# Patient Record
Sex: Female | Born: 1950 | Race: White | Hispanic: No | Marital: Married | Smoking: Never smoker
Health system: Southern US, Community
[De-identification: ages and names within clinical notes are randomized; demographics above are authoritative.]

## PROBLEM LIST (undated history)

## (undated) DIAGNOSIS — E785 Hyperlipidemia, unspecified: Secondary | ICD-10-CM

## (undated) DIAGNOSIS — M1990 Unspecified osteoarthritis, unspecified site: Secondary | ICD-10-CM

## (undated) HISTORY — DX: Hyperlipidemia, unspecified: E78.5

## (undated) HISTORY — PX: HIP SURGERY: SHX245

## (undated) HISTORY — DX: Unspecified osteoarthritis, unspecified site: M19.90

---

## 1978-12-20 HISTORY — PX: VAGINAL HYSTERECTOMY: SUR661

## 1978-12-20 HISTORY — PX: BREAST EXCISIONAL BIOPSY: SUR124

## 2007-09-14 ENCOUNTER — Ambulatory Visit: Payer: Self-pay | Admitting: Internal Medicine

## 2007-09-14 ENCOUNTER — Ambulatory Visit: Payer: Self-pay | Admitting: Unknown Physician Specialty

## 2007-10-12 ENCOUNTER — Ambulatory Visit: Payer: Self-pay | Admitting: Specialist

## 2008-07-18 ENCOUNTER — Ambulatory Visit: Payer: Self-pay | Admitting: Internal Medicine

## 2008-09-17 ENCOUNTER — Ambulatory Visit: Payer: Self-pay | Admitting: Internal Medicine

## 2009-09-19 ENCOUNTER — Ambulatory Visit: Payer: Self-pay | Admitting: Internal Medicine

## 2010-10-06 ENCOUNTER — Ambulatory Visit: Payer: Self-pay | Admitting: Internal Medicine

## 2011-10-08 ENCOUNTER — Ambulatory Visit: Payer: Self-pay | Admitting: Internal Medicine

## 2012-03-16 ENCOUNTER — Ambulatory Visit: Payer: Self-pay | Admitting: Orthopedic Surgery

## 2012-10-24 ENCOUNTER — Ambulatory Visit: Payer: Self-pay | Admitting: Internal Medicine

## 2013-09-08 ENCOUNTER — Other Ambulatory Visit: Payer: Self-pay | Admitting: Physician Assistant

## 2013-09-08 LAB — CLOSTRIDIUM DIFFICILE BY PCR

## 2013-09-09 ENCOUNTER — Emergency Department: Payer: Self-pay | Admitting: Emergency Medicine

## 2013-09-09 LAB — COMPREHENSIVE METABOLIC PANEL
Albumin: 3.6 g/dL (ref 3.4–5.0)
Anion Gap: 6 — ABNORMAL LOW (ref 7–16)
BUN: 10 mg/dL (ref 7–18)
Bilirubin,Total: 0.2 mg/dL (ref 0.2–1.0)
Calcium, Total: 9.3 mg/dL (ref 8.5–10.1)
Chloride: 105 mmol/L (ref 98–107)
Co2: 27 mmol/L (ref 21–32)
EGFR (African American): >60
EGFR (Non-African Amer.): 59 — ABNORMAL LOW
Osmolality: 276 (ref 275–301)
Potassium: 3.5 mmol/L (ref 3.5–5.1)
SGPT (ALT): 16 U/L (ref 12–78)
Sodium: 138 mmol/L (ref 136–145)
Total Protein: 7.3 g/dL (ref 6.4–8.2)

## 2013-09-09 LAB — URINALYSIS, COMPLETE
Bilirubin,UR: NEGATIVE
Blood: NEGATIVE
Glucose,UR: NEGATIVE mg/dL (ref 0–75)
Ketone: NEGATIVE
Nitrite: NEGATIVE
Protein: NEGATIVE
Specific Gravity: 1.005 (ref 1.003–1.030)

## 2013-09-09 LAB — CBC
HCT: 36.1 % (ref 35.0–47.0)
MCH: 29.8 pg (ref 26.0–34.0)
Platelet: 387 10*3/uL (ref 150–440)
RDW: 12.9 % (ref 11.5–14.5)
WBC: 10 10*3/uL (ref 3.6–11.0)

## 2013-09-27 ENCOUNTER — Encounter: Payer: Self-pay | Admitting: Orthopaedic Surgery

## 2013-10-20 ENCOUNTER — Encounter: Payer: Self-pay | Admitting: Orthopaedic Surgery

## 2013-10-26 ENCOUNTER — Ambulatory Visit: Payer: Self-pay | Admitting: Internal Medicine

## 2013-11-19 ENCOUNTER — Encounter: Payer: Self-pay | Admitting: Orthopaedic Surgery

## 2013-12-20 ENCOUNTER — Encounter: Payer: Self-pay | Admitting: Orthopaedic Surgery

## 2014-06-25 ENCOUNTER — Ambulatory Visit: Payer: Self-pay | Admitting: Internal Medicine

## 2014-09-27 ENCOUNTER — Ambulatory Visit: Payer: Self-pay | Admitting: Internal Medicine

## 2014-10-18 ENCOUNTER — Ambulatory Visit: Payer: Self-pay | Admitting: Internal Medicine

## 2014-10-28 ENCOUNTER — Ambulatory Visit: Payer: Self-pay | Admitting: Internal Medicine

## 2015-08-19 ENCOUNTER — Other Ambulatory Visit: Payer: Self-pay | Admitting: Internal Medicine

## 2015-08-19 DIAGNOSIS — Z1231 Encounter for screening mammogram for malignant neoplasm of breast: Secondary | ICD-10-CM

## 2015-10-31 ENCOUNTER — Ambulatory Visit: Payer: Self-pay

## 2015-11-04 ENCOUNTER — Ambulatory Visit
Admission: RE | Admit: 2015-11-04 | Discharge: 2015-11-04 | Disposition: A | Discharge status: Home / Self Care | Payer: 59 | Source: Ambulatory Visit | Attending: Internal Medicine | Admitting: Internal Medicine

## 2015-11-04 DIAGNOSIS — Z1231 Encounter for screening mammogram for malignant neoplasm of breast: Secondary | ICD-10-CM

## 2015-11-11 ENCOUNTER — Encounter: Payer: Self-pay | Admitting: Physical Therapy

## 2015-11-11 ENCOUNTER — Ambulatory Visit: Payer: 59 | Attending: Physician Assistant | Admitting: Physical Therapy

## 2015-11-11 DIAGNOSIS — M25552 Pain in left hip: Secondary | ICD-10-CM

## 2015-11-11 DIAGNOSIS — M6281 Muscle weakness (generalized): Secondary | ICD-10-CM | POA: Diagnosis present

## 2015-11-11 NOTE — Therapy (Signed)
[facility] AND [facility] MEDICINE [address] [place], [place], [address] Phone: [phone]   Fax:  [phone]  Physical Therapy Evaluation  Patient Details  Name: Melissa DakinCaroline A Digiulio MRN: [id number] Date of Birth: 07/12/1951 Referring Provider: Jannette Spannerchabon  Encounter Date: 11/11/2015      PT End of Session - 11/11/15 1253    Visit Number 1   Number of Visits 9   Date for PT Re-Evaluation 12/19/15   PT Start Time 1130   PT Stop Time 1230   PT Time Calculation (min) 60 min   Activity Tolerance Patient tolerated treatment well   Behavior During Therapy [facility] for tasks assessed/performed      History reviewed. No pertinent past medical history.  Past Surgical History  Procedure Laterality Date  . Breast biopsy Left 1980    excisional  . Vaginal hysterectomy  1980    partial    There were no vitals filed for this visit.  Visit Diagnosis:  Left hip pain - Plan: PT plan of care cert/re-cert  Muscle weakness - Plan: PT plan of care cert/re-cert      Subjective Assessment - 11/11/15 1135    Subjective Pt reports L hip pain approximately 2 months   Pertinent History 2 months of hip pain which began when pt began doing silver sneakers program. She has not stopped and pain has worsened. Pt had an injection 10/31/15 with no notable change.   Limitations Walking;House hold activities   How long can you walk comfortably? no pain with walking for short periods, with longer periods pt has pain after gait.   Diagnostic tests negative xray   Patient Stated Goals Return to exercise class, be able to walk without pain   Currently in Pain? No/denies            [facility] - 11/11/15 0001    Assessment   Medical Diagnosis left greater trochanteric bursitis   Referring Provider chabon   Prior Therapy none    Precautions   Precautions None   Restrictions   Weight Bearing Restrictions No   Balance Screen   Has the patient  fallen in the past 6 months No   Has the patient had a decrease in activity level because of a fear of falling?  No   Is the patient reluctant to leave their home because of a fear of falling?  No   Prior Function   Level of Independence Independent   Vocation Part time employment   Vocation Requirements sitting at computer for extended periods fo time   Leisure playing with granddaughter, walking, exercise class   Posture/Postural Control   Posture Comments Pt has LL discrepancy per radiography, L LE is longer than R with shoes which are blocked to support this.   ROM / Strength   AROM / PROM / Strength AROM;Strength   AROM   Overall AROM Comments hip AROM is WNL however at end range and with OP on L for IR and hip flex, produced pain in greater trochanter region. all others WNL   Strength   Overall Strength Comments decr. str. with IR. pain with hip abduction (mild)   Palpation   Palpation comment reproduction of pain with palpation of posterior facet, IT band, piriformis.       Objective: Sustained bridge hold to activate glutes in a deactivated TFL position - unable to feel this, modified but still unable to do so.  Prone hip extension - again pt  had difficulty feeling glute activation and demonstrated significant hip rotation substitution.  Seated glute squeeze with 5 sec. Holds - pt able to achieve this so issued this as her HEP. Noted definite soreness in region of her pain following this. "A good soreness".  Extensive STM performed proximal to greater trochanter. No change in tone even with more aggressive tx. And pt continued to have pain with IR following STM.  Educated pt on avoiding getting up on the wrong side when standing over the weekend.                    PT Education - 11/11/15 1251    Education provided Yes   Education Details Educated on HEP.   Person(s) Educated Patient   Methods Explanation   Comprehension Verbalized understanding              PT Long Term Goals - 11/11/15 1258    PT LONG TERM GOAL #1   Title Pt will improve LEFS from 50 to greater than 65   Baseline 50   Time 6   Period Weeks   Status New   PT LONG TERM GOAL #2   Title Pt will report no pain with walking 30 min.   Baseline pt has pain walking 20 min.   Time 6   Period Weeks   Status New   PT LONG TERM GOAL #3   Title Pt will report no pain with PROM for IR at end range.   Baseline pain with end range   Time 6   Period Weeks   Status New               Plan - 11/11/15 1255    Clinical Impression Statement Pt is apleasant 64 y/o female with c/o 2 months hip pain. Pt has pain consistent with reactive gluteal tendinopathy including pain with palpation of posterior/lateral facets of greater trochanter, trendelenberg on L, hypomobility of hip joint with medial glides. Pt also c/o pain with crossing her legs, adducting hip, IR with overpressure of hip, end range hip flex. Pt would benefit from skilled PT to address these deficits and progress to walking/exercising pain free.   Pt will benefit from skilled therapeutic intervention in order to improve on the following deficits Pain;Improper body mechanics;Decreased range of motion;Decreased strength;Decreased mobility   Rehab Potential Good   Clinical Impairments Affecting Rehab Potential motivation, leg length discrepancy (per radiography from previous hip surgery)   PT Frequency 2x / week   PT Duration 6 weeks   PT Treatment/Interventions Dry needling;Therapeutic exercise;Manual techniques;Functional mobility training         Problem List There are no active problems to display for this patient.   Fisher,Benjamin PT 11/11/2015, 1:01 PM  [place] [facility] PHYSICAL AND SPORTS MEDICINE [address] [place], [address] Phone: [phone]   Fax:  [phone]  Name: Melissa Wheeler MRN: [id number] Date of Birth: January 23, 1951

## 2015-11-17 ENCOUNTER — Ambulatory Visit: Payer: 59 | Admitting: Physical Therapy

## 2015-11-17 DIAGNOSIS — M25552 Pain in left hip: Secondary | ICD-10-CM | POA: Diagnosis not present

## 2015-11-17 NOTE — Therapy (Signed)
[place] [facility] [address] [place], [place], [address] Phone: [phone]   Fax:  [phone]  Physical Therapy Treatment  Patient Details  Name: Melissa DakinCaroline A Albornoz MRN: [id number] Date of Birth: [birth year] Referring Provider: Jannette Spannerchabon  Encounter Date: 11/17/2015      PT End of Session - 11/17/15 0828    Visit Number 2   Number of Visits 9   Date for PT Re-Evaluation 12/19/15   PT Start Time 0745   PT Stop Time 0828   PT Time Calculation (min) 43 min   Activity Tolerance Patient tolerated treatment well   Behavior During Therapy [facility] for [facility] assessed/performed      No past medical history on file.  Past Surgical History  Procedure Laterality Date  . Breast biopsy Left 1980    excisional  . Vaginal hysterectomy  1980    partial    There were no vitals filed for this visit.  Visit Diagnosis:  Left hip pain      Subjective Assessment - 11/17/15 0746    Subjective Pt wore high heels over the weekend and this produced significantly incr. pain.   Pertinent History 2 months of hip pain which began when pt began doing silver sneakers program. She has not stopped and pain has worsened. Pt had an injection 10/31/15 with no notable change.   Limitations Walking;House hold activities   How long can you walk comfortably? no pain with walking for short periods, with longer periods pt has pain after gait.   Diagnostic tests negative xray   Patient Stated Goals Return to exercise class, be able to walk without pain   Currently in Pain? Yes   Pain Score 3                Objective: The Stick STM performed on vastus lateralis, TFL, IT band.  STM performed on vastus lateralis, TFL.  SL medial hip glides 5x1 min.  Manual stretching for IR 3x30 oscillations with hold at end range.  Stairs on 2nd step lunge stretch 5x30 reps.  Pt reported decr. Pain following stretch and STM, continues to have moderate  level of irritation with standing and gait.                  PT Education - 11/17/15 0828    Education provided Yes   Education Details HEP, avoiding heels   Person(s) Educated Patient   Comprehension Verbalized understanding;Returned demonstration             PT Long Term Goals - 11/11/15 1258    PT LONG TERM GOAL #1   Title Pt will improve LEFS from 50 to greater than 65   Baseline 50   Time 6   Period Weeks   Status New   PT LONG TERM GOAL #2   Title Pt will report no pain with walking 30 min.   Baseline pt has pain walking 20 min.   Time 6   Period Weeks   Status New   PT LONG TERM GOAL #3   Title Pt will report no pain with PROM for IR at end range.   Baseline pain with end range   Time 6   Period Weeks   Status New               Plan - 11/17/15 0829    Clinical Impression Statement Pt demonstrates incr. antalgic gait, decr. stance time on L due to incr. pain  at rest. Overall pain improved within session primarily with femoral medial glides, as well as rolling and STM. Pt also demonstrates significant limitation in hip IR and may benefit from continued PT to address this.   Pt will benefit from skilled therapeutic intervention in order to improve on the following deficits Pain;Improper body mechanics;Decreased range of motion;Decreased strength;Decreased mobility   Rehab Potential Good   Clinical Impairments Affecting Rehab Potential motivation, leg length discrepancy (per radiography from previous hip surgery)   PT Frequency 2x / week   PT Duration 6 weeks   PT Treatment/Interventions Dry needling;Therapeutic exercise;Manual techniques;Functional mobility training        Problem List There are no active problems to display for this patient.   Elizabeth Paulsen PT 11/17/2015, 9:43 AM  [place] [facility] PHYSICAL AND SPORTS MEDICINE [address] [place], [address] Phone: [phone]   Fax:   [phone]  Name: SHAWAN TOSH MRN: [id number] Date of Birth: 04/06/1951

## 2015-11-19 ENCOUNTER — Ambulatory Visit: Payer: 59 | Admitting: Physical Therapy

## 2015-11-19 DIAGNOSIS — M6281 Muscle weakness (generalized): Secondary | ICD-10-CM

## 2015-11-19 DIAGNOSIS — M25552 Pain in left hip: Secondary | ICD-10-CM | POA: Diagnosis not present

## 2015-11-19 NOTE — Therapy (Signed)
[place] [facility] MEDIC[facility] [address] [place], [place], [address] Phone: [phone]   Fax:  [phone]  Physical Therapy Treatment  Patient Details  Name: Melissa DakinCaroline A Wheeler MRN: [id number] Date of Birth: October 07, 1951 Referring Provider: Jannette Spannerchabon  Encounter Date: 11/19/2015      PT End of Session - 11/19/15 1201    Visit Number 3   Number of Visits 9   Date for PT Re-Evaluation 12/19/15   PT Start Time 1110   PT Stop Time 1150   PT Time Calculation (min) 40 min   Activity Tolerance Patient tolerated treatment well   Behavior During Therapy [facility] for tasks assessed/performed      No past medical history on file.  Past Surgical History  Procedure Laterality Date  . Breast biopsy Left 1980    excisional  . Vaginal hysterectomy  1980    partial    There were no vitals filed for this visit.  Visit Diagnosis:  Left hip pain  Muscle weakness      Subjective Assessment - 11/19/15 1113    Subjective Pt reports she had a very good day yesterday, today she has had incr. pain with walking around. Monday after her session c/o severe pain.   Pertinent History 2 months of hip pain which began when pt began doing silver sneakers program. She has not stopped and pain has worsened. Pt had an injection 10/31/15 with no notable change.   Limitations Walking;House hold activities   How long can you walk comfortably? no pain with walking for short periods, with longer periods pt has pain after gait.   Diagnostic tests negative xray   Patient Stated Goals Return to exercise class, be able to walk without pain   Currently in Pain? Yes   Pain Score 2    Pain Location Ankle        Objective: STM performed on IT band, TFL, vastus lateralis. Following this pt had improvement in pain with SLS, no change in pain with gait.  Medial hip glides 3x30 grade IV.  IR improved from 5 to 15 degr. Following this.  IR stretch 3x30 sec.  Holds.  Supine legs apart knee flops focusing on improving hip Er/IR ROM.  Supine bridge with cuing for glute activation 3x10 with 3 sec. Holds. Pt able to feel glute activation with cuing to shorten lever arm.  Ended session with anterior hip pain but no other pain.                         PT Education - 11/19/15 1201    Education provided Yes   Education Details bridge variation   Person(s) Educated Patient   Methods Explanation   Comprehension Verbalized understanding;Returned demonstration             PT Long Term Goals - 11/11/15 1258    PT LONG TERM GOAL #1   Title Pt will improve LEFS from 50 to greater than 65   Baseline 50   Time 6   Period Weeks   Status New   PT LONG TERM GOAL #2   Title Pt will report no pain with walking 30 min.   Baseline pt has pain walking 20 min.   Time 6   Period Weeks   Status New   PT LONG TERM GOAL #3   Title Pt will report no pain with PROM for IR at end range.   Baseline pain with  end range   Time 6   Period Weeks   Status New               Plan - 11/19/15 1202    Clinical Impression Statement Pt continues to have difficulty and pain with walking - at end of session no pain with SLS. highly sensitive to STM in IT band region but pt is stlil responding well to glides. difficulty with gait, pain, and muscle weakness.   Pt will benefit from skilled therapeutic intervention in order to improve on the following deficits Pain;Improper body mechanics;Decreased range of motion;Decreased strength;Decreased mobility   Rehab Potential Good   Clinical Impairments Affecting Rehab Potential motivation, leg length discrepancy (per radiography from previous hip surgery)   PT Frequency 2x / week   PT Duration 6 weeks   PT Treatment/Interventions Dry needling;Therapeutic exercise;Manual techniques;Functional mobility training        Problem List There are no active problems to display for this  patient.   Fisher,Benjamin PT 11/19/2015, 12:06 PM  [place] [facility]TS MEDICINE [address] [place], [address] Phone: [phone]   Fax:  [phone]  Name: Melissa Wheeler MRN: [id number] Date of Birth: 03-13-1951

## 2015-11-24 ENCOUNTER — Ambulatory Visit: Payer: Medicare Other | Attending: Physician Assistant | Admitting: Physical Therapy

## 2015-11-24 DIAGNOSIS — M25552 Pain in left hip: Secondary | ICD-10-CM | POA: Insufficient documentation

## 2015-11-24 DIAGNOSIS — M6281 Muscle weakness (generalized): Secondary | ICD-10-CM | POA: Diagnosis present

## 2015-11-24 NOTE — Therapy (Signed)
[place] [facility] [address] [place], [place], [address] Phone: [phone]   Fax:  [phone]  Physical Therapy Treatment  Patient Details  Name: Melissa DakinCaroline A Claunch MRN: [id number] Date of Birth: [birth year] Referring Provider: Jannette Spannerchabon  Encounter Date: 11/24/2015      PT End of Session - 11/24/15 1556    Visit Number 4   Number of Visits 9   Date for PT Re-Evaluation 12/19/15   PT Start Time 0315   PT Stop Time 0355   PT Time Calculation (min) 40 min   Activity Tolerance Patient tolerated treatment well   Behavior During Therapy [facility] for tasks assessed/performed      No past medical history on file.  Past Surgical History  Procedure Laterality Date  . Breast biopsy Left 1980    excisional  . Vaginal hysterectomy  1980    partial    There were no vitals filed for this visit.  Visit Diagnosis:  Left hip pain  Muscle weakness      Subjective Assessment - 11/24/15 1512    Subjective Pt reports she is doing much better, no pain but a "pinching" feeling in anterior hip with walking.    Pertinent History 2 months of hip pain which began when pt began doing silver sneakers program. She has not stopped and pain has worsened. Pt had an injection 10/31/15 with no notable change.   Limitations Walking;House hold activities   How long can you walk comfortably? no pain with walking for short periods, with longer periods pt has pain after gait.   Diagnostic tests negative xray   Patient Stated Goals Return to exercise class, be able to walk without pain   Currently in Pain? Yes   Pain Score 2   only with walking        Objective: Trigger point dry needling performed on distal adductor and proximal rectus femoris with significant local twitch response. Following this noted significant decr. Pain with gait.  Belt assisted lateral hip glides 3x30 grade II. Hip IR improved from 10 to 25 degr. Following  this.  Seated stretch for IR - Pt reports she has stretch in region where she gets pain in her anterior hip with this.  Performed standing wt shift with and without leg lift to practice movement into single leg stance with appropriate muscle activation. Difficulty shifting onto L but able to perform with cuing to avoid tensing into movement.                         PT Education - 11/24/15 1554    Education provided Yes   Education Details Seated pretzel stretch   Person(s) Educated Patient   Methods Explanation   Comprehension Verbalized understanding;Returned demonstration             PT Long Term Goals - 11/11/15 1258    PT LONG TERM GOAL #1   Title Pt will improve LEFS from 50 to greater than 65   Baseline 50   Time 6   Period Weeks   Status New   PT LONG TERM GOAL #2   Title Pt will report no pain with walking 30 min.   Baseline pt has pain walking 20 min.   Time 6   Period Weeks   Status New   PT LONG TERM GOAL #3   Title Pt will report no pain with PROM for IR at end range.  Baseline pain with end range   Time 6   Period Weeks   Status New               Plan - 11/24/15 1647    Clinical Impression Statement Pt has made significant improvement overall with pain, is now primarily complaining of an anterior pinching feeling. Pt responded well to trigger point dry needling. altered gait and continued pain in L hip.   Pt will benefit from skilled therapeutic intervention in order to improve on the following deficits Pain;Improper body mechanics;Decreased range of motion;Decreased strength;Decreased mobility   Rehab Potential Good   Clinical Impairments Affecting Rehab Potential motivation, leg length discrepancy (per radiography from previous hip surgery)   PT Frequency 2x / week   PT Duration 6 weeks   PT Treatment/Interventions Dry needling;Therapeutic exercise;Manual techniques;Functional mobility training        Problem  List There are no active problems to display for this patient.   Fisher,Benjamin PT, DPT 11/24/2015, 4:51 PM  [place] [facility] PHYSICAL AND SPORTS MEDICINE [address] [place], [address] Phone: [phone]   Fax:  [phone]  Name: Melissa Wheeler MRN: [id number] Date of Birth: 16-Nov-1951

## 2015-11-27 ENCOUNTER — Ambulatory Visit: Payer: Medicare Other | Admitting: Physical Therapy

## 2015-11-27 DIAGNOSIS — M25552 Pain in left hip: Secondary | ICD-10-CM

## 2015-11-27 DIAGNOSIS — M6281 Muscle weakness (generalized): Secondary | ICD-10-CM

## 2015-11-27 NOTE — Therapy (Signed)
[place] [facility] PH[facility] SPORTS MEDICINE [address] [place], [address] Phone: [phone]   Fax:  [phone]  Physical Therapy Treatment  Patient Details  Name: Melissa Wheeler MRN: [id number] Date of Birth: 15-Nov-1951 Referring Provider: Jannette Spanner  Encounter Date: 11/27/2015      PT End of Session - 11/27/15 0843    Visit Number 5   Number of Visits 9   Date for PT Re-Evaluation 12/19/15   PT Start Time 0800   PT Stop Time 0830   PT Time Calculation (min) 30 min   Activity Tolerance Patient tolerated treatment well   Behavior During Therapy [facility] for tasks assessed/performed      No past medical history on file.  Past Surgical History  Procedure Laterality Date  . Breast biopsy Left 1980    excisional  . Vaginal hysterectomy  1980    partial    There were no vitals filed for this visit.  Visit Diagnosis:  Left hip pain  Muscle weakness      Subjective Assessment - 11/27/15 0801    Subjective Pt reports no pain, continues to have pinch. Felt significant relief for two days and then pinching feeling returned yesterday.   Pertinent History 2 months of hip pain which began when pt began doing silver sneakers program. She has not stopped and pain has worsened. Pt had an injection 10/31/15 with no notable change.   Limitations Walking;House hold activities   How long can you walk comfortably? no pain with walking for short periods, with longer periods pt has pain after gait.   Diagnostic tests negative xray   Patient Stated Goals Return to exercise class, be able to walk without pain   Currently in Pain? No/denies   Pain Score 0-No pain         Objective: Trigger point dry needling performed on L distal adductor in multiple locations with significant c/o pain, worse as needles moved proximally.  STM performed in same region which was well tolerated with goal of reducing tone inanterior musculature to reduce  pinching in inguinal region.  Discontinued chair stretch as this is not helping pt. Cued pt to avoid end range knee flop.   kegel modified with breathing to activate adductors.  Single leg standing toe taps behind back focusing on maintaining good control.  Pt had no difficulty with exercises but required cuing to perform well and to avoid compensatory hip lean.                       PT Education - 11/27/15 0803    Education provided Yes   Education Details educated pt on new HEP   Person(s) Educated Patient   Methods Explanation   Comprehension Verbalized understanding;Returned demonstration             PT Long Term Goals - 11/11/15 1258    PT LONG TERM GOAL #1   Title Pt will improve LEFS from 50 to greater than 65   Baseline 50   Time 6   Period Weeks   Status New   PT LONG TERM GOAL #2   Title Pt will report no pain with walking 30 min.   Baseline pt has pain walking 20 min.   Time 6   Period Weeks   Status New   PT LONG TERM GOAL #3   Title Pt will report no pain with PROM for IR at end range.   Baseline pain  with end range   Time 6   Period Weeks   Status New               Plan - 11/27/15 0844    Clinical Impression Statement Decr. pinching feeling overall and no pain. Pt continues to have significant pain with palpation of distal adductor and impaired gait.   Pt will benefit from skilled therapeutic intervention in order to improve on the following deficits Pain;Improper body mechanics;Decreased range of motion;Decreased strength;Decreased mobility   Rehab Potential Good   Clinical Impairments Affecting Rehab Potential motivation, leg length discrepancy (per radiography from previous hip surgery)   PT Frequency 2x / week   PT Duration 6 weeks   PT Treatment/Interventions Dry needling;Therapeutic exercise;Manual techniques;Functional mobility training        Problem List There are no active problems to display for this  patient.   Garek Schuneman PT DPT 11/27/2015, 8:49 AM  [place] [facility] [address] [place], [place], [address] Phone: [phone]   Fax:  [phone]  Name: Melissa Wheeler MRN: [id number] Date of Birth: [birth year]

## 2015-12-01 ENCOUNTER — Ambulatory Visit: Payer: Medicare Other | Admitting: Physical Therapy

## 2015-12-01 DIAGNOSIS — M25552 Pain in left hip: Secondary | ICD-10-CM | POA: Diagnosis not present

## 2015-12-01 DIAGNOSIS — M6281 Muscle weakness (generalized): Secondary | ICD-10-CM

## 2015-12-01 NOTE — Therapy (Signed)
[place] [address] [place], [address] Phone: [phone]   Fax:  [phone]  Physical Therapy Treatment  Patient Details  Name: Melissa Wheeler MRN: [id number] Date of Birth: 07-04-1951 Referring Provider: Jannette Spanner  Encounter Date: 12/01/2015      PT End of Session - 12/01/15 1516    Visit Number 6   Number of Visits 9   Date for PT Re-Evaluation 12/19/15   PT Start Time 1515   PT Stop Time 1545   PT Time Calculation (min) 30 min   Activity Tolerance Patient tolerated treatment well   Behavior During Therapy [facility] for tasks assessed/performed      No past medical history on file.  Past Surgical History  Procedure Laterality Date  . Breast biopsy Left 1980    excisional  . Vaginal hysterectomy  1980    partial    There were no vitals filed for this visit.  Visit Diagnosis:  Left hip pain  Muscle weakness      Subjective Assessment - 12/01/15 1513    Subjective "you cured me". Pt reports she is doing significantly better today.    Pertinent History 2 months of hip pain which began when pt began doing silver sneakers program. She has not stopped and pain has worsened. Pt had an injection 10/31/15 with no notable change.   Limitations Walking;House hold activities   How long can you walk comfortably? no pain with walking for short periods, with longer periods pt has pain after gait.   Diagnostic tests negative xray   Patient Stated Goals Return to exercise class, be able to walk without pain   Currently in Pain? No/denies                Objective: Supine position STM performed on distal adductor, quad, IT band region.   Trigger point dry needling performed on these same regions (vastus lateralis rather than IT band region).   Significant pain noted with dry needling. Following this pt performed knee flops, amb in clinic to facilitate maintenance of improved hip IR (IR  improved to 45 degr. From 25 degr.)  Pt encouraged to continue with HEP.                       PT Long Term Goals - 11/11/15 1258    PT LONG TERM GOAL #1   Title Pt will improve LEFS from 50 to greater than 65   Baseline 50   Time 6   Period Weeks   Status New   PT LONG TERM GOAL #2   Title Pt will report no pain with walking 30 min.   Baseline pt has pain walking 20 min.   Time 6   Period Weeks   Status New   PT LONG TERM GOAL #3   Title Pt will report no pain with PROM for IR at end range.   Baseline pain with end range   Time 6   Period Weeks   Status New               Plan - 12/01/15 1548    Clinical Impression Statement Pt is now feeling nearly 100% better. She is no longer having adductor pain. Pt primarily now has soreness rather than pain. PT encouraged pt to return to her more normal activities including walking.   Pt will benefit from skilled therapeutic intervention in order to improve on the following  deficits Pain;Improper body mechanics;Decreased range of motion;Decreased strength;Decreased mobility   Rehab Potential Good   Clinical Impairments Affecting Rehab Potential t i   PT Frequency 2x / week   PT Duration 6 weeks   PT Treatment/Interventions Dry needling;Therapeutic exercise;Manual techniques;Functional mobility training        Problem List There are no active problems to display for this patient.   Fisher,Benjamin PT DPT 12/01/2015, 3:58 PM  [facility] [address] [place], [place], [address] Phone: [phone]   Fax:  [phone]  Name: Melissa Wheeler MRN: [id number] Date of Birth: May 20, 1951

## 2015-12-03 ENCOUNTER — Ambulatory Visit: Payer: Medicare Other | Admitting: Physical Therapy

## 2015-12-08 ENCOUNTER — Ambulatory Visit: Payer: Medicare Other | Admitting: Physical Therapy

## 2015-12-08 DIAGNOSIS — M6281 Muscle weakness (generalized): Secondary | ICD-10-CM

## 2015-12-08 DIAGNOSIS — M25552 Pain in left hip: Secondary | ICD-10-CM | POA: Diagnosis not present

## 2015-12-08 NOTE — Therapy (Signed)
[place] [address], [place], [address] Phone: [phone]   Fax:  [phone]  Physical Therapy Treatment  Patient Details  Name: Melissa Wheeler MRN: [id number] Date of Birth: 05-15-1951 Referring Provider: Jannette Spanner  Encounter Date: 12/08/2015      PT End of Session - 12/08/15 0957    Visit Number 6   Number of Visits 9   Date for PT Re-Evaluation 12/19/15   PT Start Time 0945   PT Stop Time 1013   PT Time Calculation (min) 28 min   Activity Tolerance Patient tolerated treatment well   Behavior During Therapy [facility] for tasks assessed/performed      No past medical history on file.  Past Surgical History  Procedure Laterality Date  . Breast biopsy Left 1980    excisional  . Vaginal hysterectomy  1980    partial    There were no vitals filed for this visit.  Visit Diagnosis:  Muscle weakness      Subjective Assessment - 12/08/15 0945    Subjective Pt reports no symptoms since previous session. "I'm not even sure I should be here".   Pertinent History 2 months of hip pain which began when pt began doing silver sneakers program. She has not stopped and pain has worsened. Pt had an injection 10/31/15 with no notable change.   How long can you walk comfortably? no pain with walking for short periods, with longer periods pt has pain after gait.   Diagnostic tests negative xray   Patient Stated Goals Return to exercise class, be able to walk without pain            objective: Reviewed HEP, answered pt questions regarding continuing with and how to progress HEP.  Assessed gait, pt demonstrates no lateral whip/knee valgus and improved pelvic control with gait.  Educated and had pt verbalize understanding of postural modifications: specifically to avoid crossing legs, to avoid excessive elevation stepping up, femoral positioning for ADLs requiring squatting, lunging, kneeling to minimize  compression of trochanter.  Sit<>stand with no UE with cuing for avoiding knee valgus.  Pt verbalized understanding of all exercises and home management/chore modification.                       PT Education - 12/08/15 0957    Education provided Yes   Education Details modifications to exercise routine when she returns to silver sneakers program.   Person(s) Educated Patient   Methods Explanation   Comprehension Verbalized understanding             PT Long Term Goals - 12/08/15 1026    PT LONG TERM GOAL #1   Title Pt will improve LEFS from 50 to greater than 65   Time 6   Period Weeks   Status Achieved   PT LONG TERM GOAL #2   Title Pt will report no pain with walking 30 min.   Baseline pt has pain walking 20 min.   Time 6   Period Weeks   Status Achieved   PT LONG TERM GOAL #3   Title Pt will report no pain with PROM for IR at end range.   Baseline pain with end range   Time 6   Period Weeks   Status Achieved               Plan - 12/08/15 1016    Clinical Impression Statement Pt pain is  now resolved. Pt is appropriate for D/C from PT as she is pain free, able to walk and to perform all typical ADLs with no difficulty. Issued final HEP to maintain pain free motion.        Problem List There are no active problems to display for this patient.   Fisher,Benjamin PT DPT 12/08/2015, 10:29 AM  [facility] [address] [place], [place], [address] Phone: [phone]   Fax:  [phone]  Name: Melissa Wheeler MRN: [id number] Date of Birth: 1951-09-03

## 2015-12-17 ENCOUNTER — Encounter: Payer: 59 | Admitting: Physical Therapy

## 2016-08-20 ENCOUNTER — Other Ambulatory Visit: Payer: Self-pay | Admitting: Internal Medicine

## 2016-08-20 DIAGNOSIS — R0989 Other specified symptoms and signs involving the circulatory and respiratory systems: Secondary | ICD-10-CM

## 2016-08-20 DIAGNOSIS — IMO0001 Reserved for inherently not codable concepts without codable children: Secondary | ICD-10-CM

## 2016-10-13 ENCOUNTER — Other Ambulatory Visit (INDEPENDENT_AMBULATORY_CARE_PROVIDER_SITE_OTHER): Payer: Self-pay | Admitting: Internal Medicine

## 2016-10-13 DIAGNOSIS — R0989 Other specified symptoms and signs involving the circulatory and respiratory systems: Secondary | ICD-10-CM

## 2016-10-14 ENCOUNTER — Encounter (INDEPENDENT_AMBULATORY_CARE_PROVIDER_SITE_OTHER): Payer: Self-pay | Admitting: Vascular Surgery

## 2016-10-14 ENCOUNTER — Ambulatory Visit (INDEPENDENT_AMBULATORY_CARE_PROVIDER_SITE_OTHER): Payer: Medicare Other | Admitting: Vascular Surgery

## 2016-10-14 ENCOUNTER — Ambulatory Visit (INDEPENDENT_AMBULATORY_CARE_PROVIDER_SITE_OTHER): Payer: Medicare Other

## 2016-10-14 DIAGNOSIS — E7800 Pure hypercholesterolemia, unspecified: Secondary | ICD-10-CM | POA: Diagnosis not present

## 2016-10-14 DIAGNOSIS — R0989 Other specified symptoms and signs involving the circulatory and respiratory systems: Secondary | ICD-10-CM

## 2016-10-14 DIAGNOSIS — J4520 Mild intermittent asthma, uncomplicated: Secondary | ICD-10-CM | POA: Diagnosis not present

## 2016-10-14 DIAGNOSIS — I77811 Abdominal aortic ectasia: Secondary | ICD-10-CM | POA: Insufficient documentation

## 2016-10-14 DIAGNOSIS — M1611 Unilateral primary osteoarthritis, right hip: Secondary | ICD-10-CM | POA: Diagnosis not present

## 2016-10-14 DIAGNOSIS — E785 Hyperlipidemia, unspecified: Secondary | ICD-10-CM | POA: Insufficient documentation

## 2016-10-14 NOTE — Progress Notes (Signed)
Melissa Wheeler  MRN : [id number]  Griffin DakinCaroline A Wheeler is a 65 y.o. (25-May-1951) female who presents with chief complaint of  Chief Complaint  Patient presents with  . New Patient (Initial Visit)  .  History of Present Illness: The patient presents to the office for evaluation of an abdominal aortic aneurysm. The patient is concerned because her mother had an AAA which was repaired at the age of 66. Patient denies abdominal pain or unusual back pain, no other abdominal complaints.  No history of an acute onset of painful blue discoloration of the toes.     Positive family history of AAA.   She is a nonsmoker.  Patient denies amaurosis fugax or TIA symptoms. There is no history of claudication or rest pain symptoms of the lower extremities.  The patient denies angina or shortness of breath.  AAA ultrasound shows an AAA/aortic ectasia that measures 2.2 cm  Current Meds  Medication Sig  . atorvastatin (LIPITOR) 20 MG tablet Take by mouth.  . Calcium Carbonate-Vit D-Min (CALCIUM 1200 PO) Take by mouth.    Past Medical History:  Diagnosis Date  . Hyperlipidemia     Past Surgical History:  Procedure Laterality Date  . BREAST BIOPSY Left 1980   excisional  . HIP SURGERY Right   . VAGINAL HYSTERECTOMY  1980   partial    Social History Social History  Substance Use Topics  . Smoking status: Never Smoker  . Smokeless tobacco: Never Used  . Alcohol use Yes    Family History Family History  Problem Relation Age of Onset  . AAA (abdominal aortic aneurysm) Mother   . Hyperlipidemia Mother   . Heart disease Mother   . Hyperlipidemia Father   . Heart disease Father   No family history of bleeding/clotting disorders, porphyria or autoimmune disease   Allergies  Allergen Reactions  . Promethazine Other (See Comments)     REVIEW OF SYSTEMS (Negative unless checked)  Constitutional: [] Weight loss  [] Fever   [] Chills Cardiac: [] Chest pain   [] Chest pressure   [] Palpitations   [] Shortness of breath when laying flat   [] Shortness of breath with exertion. Vascular:  [] Pain in legs with walking   [] Pain in legs at rest  [] History of DVT   [] Phlebitis   [] Swelling in legs   [] Varicose veins   [] Non-healing ulcers Pulmonary:   [] Uses home oxygen   [] Productive cough   [] Hemoptysis   [] Wheeze  [] COPD   [x] Asthma Neurologic:  [] Dizziness   [] Seizures   [] History of stroke   [] History of TIA  [] Aphasia   [] Vissual changes   [] Weakness or numbness in arm   [] Weakness or numbness in leg Musculoskeletal:   [] Joint swelling   [x] Joint pain   [] Low back pain Hematologic:  [] Easy bruising  [] Easy bleeding   [] Hypercoagulable state   [] Anemic Gastrointestinal:  [] Diarrhea   [] Vomiting  [x] Gastroesophageal reflux/heartburn   [] Difficulty swallowing. Genitourinary:  [] Chronic kidney disease   [] Difficult urination  [] Frequent urination   [] Blood in urine Skin:  [] Rashes   [] Ulcers  Psychological:  [] History of anxiety   []  History of major depression.  Wheeler Examination  Vitals:   10/14/16 0848  BP: 118/72  Pulse: 64  Resp: 17  Weight: 59.9 kg (132 lb)  Height: 5' 2.5" (1.588 m)   Body mass index is 23.76 kg/m. Gen: WD/WN, NAD Head: [place]/AT, No temporalis wasting.  Ear/Nose/Throat: Hearing grossly intact, nares w/o erythema or drainage, poor  dentition Eyes: PER, EOMI, sclera nonicteric.  Neck: Supple, no masses.  No bruit or JVD.  Pulmonary:  Good air movement, clear to auscultation bilaterally, no use of accessory muscles.  Cardiac: RRR, normal S1, S2, no Murmurs. Vascular:   Popliteal pulses normal no enlargement Vessel Right Left  Radial Palpable Palpable  Ulnar Palpable Palpable  Brachial Palpable Palpable  Carotid Palpable Palpable  Femoral Palpable Palpable  Popliteal Palpable Palpable  PT Palpable Palpable  DP Palpable Palpable   Gastrointestinal: soft, non-distended. No guarding/no  peritoneal signs.  Musculoskeletal: M/S 5/5 throughout.  Right leg longer than the left (iatrogenic s/p hip replacement)No atrophy.  Neurologic: CN 2-12 intact. Pain and light touch intact in extremities.  Right leg longer than the left (iatrogenic s/p hip replacement).  Speech is fluent. Motor exam as listed above. Psychiatric: Judgment intact, Mood & affect appropriate for pt's clinical situation. Dermatologic: No rashes or ulcers noted.  No changes consistent with cellulitis. Lymph : No Cervical lymphadenopathy, no lichenification or skin changes of chronic lymphedema.  CBC Lab Results  Component Value Date   WBC 10.0 09/09/2013   HGB 12.1 09/09/2013   HCT 36.1 09/09/2013   MCV 89 09/09/2013   PLT 387 09/09/2013    BMET    Component Value Date/Time   NA 138 09/09/2013 1154   K 3.5 09/09/2013 1154   CL 105 09/09/2013 1154   CO2 27 09/09/2013 1154   GLUCOSE 121 (H) 09/09/2013 1154   BUN 10 09/09/2013 1154   CREATININE 1.02 09/09/2013 1154   CALCIUM 9.3 09/09/2013 1154   GFRNONAA 59 (L) 09/09/2013 1154   GFRAA >60 09/09/2013 1154   CrCl cannot be calculated (Patient's most recent lab result is older than the maximum 21 days allowed.).  COAG No results found for: INR, PROTIME  Radiology No results found.   Assessment/Plan 1. Abdominal aortic ectasia (HCC) No surgery or intervention at this time. The patient has an asymptomatic abdominal aortic  Ectasia that is less than 3 cm in maximal diameter.  I have discussed the natural history of abdominal aortic aneurysm and the small risk of rupture for aneurysm less than 5 cm in size.  However, as these small aneurysms tend to enlarge over time, continued surveillance with ultrasound or CT scan is mandatory.  I have also discussed optimizing medical management with hypertension and lipid control and the importance of abstinence from tobacco.  The patient is also encouraged to exercise a minimum of 30 minutes 4 times a week.   Should the patient develop new onset abdominal or back pain or signs of peripheral embolization they are instructed to seek medical attention immediately and to alert the physician providing care that they have an aneurysm.  The patient voices their understanding. The patient follow up in about 5 years with an aortic duplex.   2. Pure hypercholesterolemia Continue statin  3. Mild intermittent asthma without complication Inhalers as prescribed no changes  4. Primary osteoarthritis of right hip Nsaids and Wheeler therapy    Levora Dredge, MD  10/14/2016 10:25 AM

## 2016-10-28 ENCOUNTER — Other Ambulatory Visit: Payer: Self-pay | Admitting: Internal Medicine

## 2016-10-28 DIAGNOSIS — Z1231 Encounter for screening mammogram for malignant neoplasm of breast: Secondary | ICD-10-CM

## 2016-12-06 ENCOUNTER — Ambulatory Visit
Admission: RE | Admit: 2016-12-06 | Discharge: 2016-12-06 | Disposition: A | Discharge status: Home / Self Care | Payer: Medicare Other | Source: Ambulatory Visit | Attending: Internal Medicine | Admitting: Internal Medicine

## 2016-12-06 ENCOUNTER — Other Ambulatory Visit: Payer: Self-pay | Admitting: Internal Medicine

## 2016-12-06 DIAGNOSIS — Z1231 Encounter for screening mammogram for malignant neoplasm of breast: Secondary | ICD-10-CM | POA: Diagnosis present

## 2017-04-14 ENCOUNTER — Other Ambulatory Visit: Payer: Self-pay | Admitting: Otolaryngology

## 2017-04-14 DIAGNOSIS — H9201 Otalgia, right ear: Secondary | ICD-10-CM

## 2017-04-19 ENCOUNTER — Ambulatory Visit
Admission: RE | Admit: 2017-04-19 | Discharge: 2017-04-19 | Disposition: A | Discharge status: Home / Self Care | Payer: Medicare Other | Source: Ambulatory Visit | Attending: Otolaryngology | Admitting: Otolaryngology

## 2017-04-19 DIAGNOSIS — H9201 Otalgia, right ear: Secondary | ICD-10-CM

## 2017-04-19 MED ORDER — IOPAMIDOL (ISOVUE-300) INJECTION 61%
75.0000 mL | Freq: Once | INTRAVENOUS | Status: AC | PRN
  Administered 2017-04-19: 75 mL via INTRAVENOUS

## 2018-01-04 ENCOUNTER — Other Ambulatory Visit: Payer: Self-pay | Admitting: Internal Medicine

## 2018-01-04 DIAGNOSIS — Z1231 Encounter for screening mammogram for malignant neoplasm of breast: Secondary | ICD-10-CM

## 2018-01-25 ENCOUNTER — Ambulatory Visit
Admission: RE | Admit: 2018-01-25 | Discharge: 2018-01-25 | Disposition: A | Discharge status: Home / Self Care | Payer: Medicare Other | Source: Ambulatory Visit | Attending: Internal Medicine | Admitting: Internal Medicine

## 2018-01-25 DIAGNOSIS — Z1231 Encounter for screening mammogram for malignant neoplasm of breast: Secondary | ICD-10-CM | POA: Diagnosis not present

## 2018-09-21 ENCOUNTER — Ambulatory Visit: Payer: Medicare Other | Attending: Student

## 2018-09-21 DIAGNOSIS — M6281 Muscle weakness (generalized): Secondary | ICD-10-CM | POA: Diagnosis present

## 2018-09-21 DIAGNOSIS — M25551 Pain in right hip: Secondary | ICD-10-CM | POA: Diagnosis present

## 2018-09-21 DIAGNOSIS — M62838 Other muscle spasm: Secondary | ICD-10-CM | POA: Diagnosis present

## 2018-09-21 NOTE — Therapy (Signed)
[facility] [address] [place], [address] Phone: [phone]   Fax:  [phone]  Physical Therapy Evaluation  Patient Details  Name: Melissa Wheeler MRN: [id number] Date of Birth: Aug 25, 1951 Referring Provider (PT): Aluisio    Encounter Date: 09/21/2018  PT End of Session - 09/21/18 0929    Visit Number  1    Number of Visits  8    Date for PT Re-Evaluation  11/21/18    Authorization Type  Medicare     PT Start Time  0830    PT Stop Time  0915    PT Time Calculation (min)  45 min    Activity Tolerance  Patient tolerated treatment well    Behavior During Therapy  [facility] for tasks assessed/performed       Past Medical History:  Diagnosis Date  . Hyperlipidemia     Past Surgical History:  Procedure Laterality Date  . BREAST EXCISIONAL BIOPSY Left 1980   x 2 benign  . HIP SURGERY Right   . VAGINAL HYSTERECTOMY  1980   partial    There were no vitals filed for this visit.   Subjective Assessment - 09/21/18 0832    Subjective  Pt reports insidious flare of Right hip pain in August, mostly noted with sitting and lying at night, achy in nature. Pt reports pain to be at the posterolateral gluteal area down to the mid thigh. Pt reports pain eventually resolved after taking melxicam. Pt was not aggravated by exercise. Pt reports regular exercise in a group setting 3-4x/week at [facility]. Pt reports a similar issue in 2016 and had some success working with Su Hoff. Pt reports 2014 anterior THA at [facility].      Pertinent History  Pt reports insidious flare of Right hip pain in August, mostly noted with sitting and lying at night, achy in nature. Pt reports pain to be at the posterolateral gluteal area down to the mid thigh. Pt reports pain eventually resolved after taking melxicam. Pt was not aggravated by exercise. Pt reports regular exercise in a group setting 3-4x/week at [facility]. Pt reports a similar issue in  2016 and had some success working with Su Hoff. Pt reports 2014 anterior THA at [facility].      How long can you sit comfortably?  None     How long can you stand comfortably?  None     How long can you walk comfortably?  None     Diagnostic tests  Xray in Sept 2019 c Aluisio in [place], everything intact.     Patient Stated Goals  Continue exercise class, prevent future exacerbation.     Currently in Pain?  No/denies         [facility] PT Assessment - 09/21/18 0001      Assessment   Medical Diagnosis  Right hip pain- posterolateral     Referring Provider (PT)  Aluisio     Prior Therapy  At this location 2016, similar      Precautions   Precautions  None      Restrictions   Weight Bearing Restrictions  No      Balance Screen   Has the patient fallen in the past 6 months  No    Has the patient had a decrease in activity level because of a fear of falling?   No    Is the patient reluctant to leave their home because of a fear of falling?  No      Home Environment   Additional Comments  bed/bath on second floor, older code steps (higher)       Prior Function   Level of Independence  Independent    Vocation  Retired    Engineer, technical sales, exercising, travel.       Sensation   Light Touch  Appears Intact      ROM / Strength   AROM / PROM / Strength  PROM;Strength      Strength   Strength Assessment Site  Hip;Knee    Right/Left Hip  Right;Left    Right Hip Flexion  5/5    Right Hip Extension  4-/5   4-/5 bent knee raise/straight leg raise   Right Hip External Rotation   4/5    Right Hip Internal Rotation  5/5    Right Hip ABduction  4-/5   horizontal abduction: 4+/5    Left Hip Flexion  4+/5    Left Hip Extension  4-/5   4-/5 bent knee raise/straight leg raise   Left Hip External Rotation  5/5    Left Hip Internal Rotation  5/5    Left Hip ABduction  4-/5   horizontal abduction: 4+/5    Right/Left Knee  Right;Left    Right Knee Flexion  5/5    Right Knee Extension  5/5     Left Knee Flexion  5/5    Left Knee Extension  5/5      Palpation   Palpation comment  pain at right external rotators      Special Tests   Other special tests  Ely's test: (+) Right    SLS: Left 35sec; Right >60sec      Transfers   Five time sit to stand comments   30-sec chair rise: 22x hands free                Objective measurements completed on examination: See above findings.      [facility] Adult PT Treatment/Exercise - 09/21/18 0001      Exercises   Exercises  Knee/Hip      Knee/Hip Exercises: Supine   Other Supine Knee/Hip Exercises  Glute max bridge c Band: 1x15      Manual Therapy   Manual Therapy  Myofascial release;Soft tissue mobilization    Soft tissue mobilization  Right hip external rotators, Right quads, Left Quads     Myofascial Release  Right hip external rotators, Right quads, Left Quads              PT Education - 09/21/18 0928    Education provided  Yes    Education Details  explained weakness in Rt external rotators, compensational overuse of Rt quads, and treatment plan     Person(s) Educated  Patient    Methods  Explanation;Demonstration    Comprehension  Verbalized understanding          PT Long Term Goals - 09/21/18 6962      PT LONG TERM GOAL #1   Title  Pt will demonstrat eindependence with advanced HEP for DC with knowledge of how to progress resistance as needed.     Time  8    Period  Weeks    Status  New      PT LONG TERM GOAL #2   Title  Pt will demonstrate 4/5 or greater strength in prone hip extension, and 4+/5 seated Rt hip External ROtation.     Time  8  Period  Weeks    Status  New      PT LONG TERM GOAL #3   Title  Pt will report no decreased pain in anterior thigh, <2x/week, and absence of posterolater hip pain ion Right.     Time  8    Period  Weeks    Status  New             Plan - 09/21/18 0930    Clinical Impression Statement  Pt presenting with weakness in bilat hip extension, bilat  hip abduction, weakness and pain with Right isometric hip external rotation. Deep palpation of Right external rotators notable for tenderness, pain, and taut bands of tissue parallel to the muscle fibers. Also noted positive Ely's test on right, with significant bilat quads muscle spasm, taut bands, and pain bilat. Functional testing is above average for age matched norms with SLS ><30sec bilat, and 30-sec chair rise=22x. Gait analysis should be performed next session to screen for trendelenburg and ascertain functional ramification of bilat glute med weakness. Pt has had dry neelding in past visits and reports success with this modality among others.     History and Personal Factors relevant to plan of care:  Prior similar episode, very active, has tolerated dry needling well in the past.     Clinical Presentation  Stable    Clinical Presentation due to:  Objective Tests and Measures     Clinical Decision Making  Moderate    Rehab Potential  Excellent    Clinical Impairments Affecting Rehab Potential  Unclear of any recent joint effusion that may have been facilitating neurological inhibition of the hip.     PT Frequency  1x / week    PT Duration  8 weeks    PT Treatment/Interventions  Dry needling;Therapeutic exercise;Manual techniques;Functional mobility training;Moist Heat;Electrical Stimulation;Patient/family education    PT Next Visit Plan  Gait assessment, expand HEP, review goals, trial dry needling and/or MFR and stretching to quads    PT Home Exercise Plan  Glute max bridge with band abduction 1x15 BID    Consulted and Agree with Plan of Care  Patient       Patient will benefit from skilled therapeutic intervention in order to improve the following deficits and impairments:  Pain, Decreased strength, Improper body mechanics, Decreased activity tolerance, Impaired flexibility  Visit Diagnosis: Muscle weakness (generalized) - Plan: PT plan of care cert/re-cert  Pain in right hip - Plan:  PT plan of care cert/re-cert  Other muscle spasm - Plan: PT plan of care cert/re-cert     Problem List Patient Active Problem List   Diagnosis Date Noted  . Abdominal aortic ectasia (HCC) 10/14/2016  . Hyperlipidemia 10/14/2016  . Intermittent asthma 10/14/2016  . Osteoarthritis of right hip 10/14/2016    9:44 AM, 09/21/18 Rosamaria Lints, PT, DPT Physical Therapist - [place] [phone] (Office)    Buccola,Allan C 09/21/2018, 9:44 AM  [facility]ORTS MEDIC[facility] [address] [place], [address] Phone: [phone]   Fax:  [phone]  Name: Melissa Wheeler MRN: [id number] Date of Birth: 03-07-1951

## 2018-10-09 ENCOUNTER — Ambulatory Visit: Payer: Medicare Other

## 2018-10-09 DIAGNOSIS — M6281 Muscle weakness (generalized): Secondary | ICD-10-CM

## 2018-10-09 DIAGNOSIS — M62838 Other muscle spasm: Secondary | ICD-10-CM

## 2018-10-09 DIAGNOSIS — M25551 Pain in right hip: Secondary | ICD-10-CM

## 2018-10-09 NOTE — Therapy (Signed)
[facility]ICINE [address], [place], [address] Phone: [phone]   Fax:  [phone]  Physical Therapy Treatment  Patient Details  Name: Melissa Wheeler MRN: [id number] Date of Birth: Feb 18, 1951 Referring Provider (PT): Aluisio    Encounter Date: 10/09/2018  PT End of Session - 10/09/18 0933    Visit Number  2    Number of Visits  8    Date for PT Re-Evaluation  11/21/18    Authorization Type  Medicare     PT Start Time  0902    PT Stop Time  0945    PT Time Calculation (min)  43 min    Activity Tolerance  Patient tolerated treatment well    Behavior During Therapy  [facility] for tasks assessed/performed       Past Medical History:  Diagnosis Date  . Hyperlipidemia     Past Surgical History:  Procedure Laterality Date  . BREAST EXCISIONAL BIOPSY Left 1980   x 2 benign  . HIP SURGERY Right   . VAGINAL HYSTERECTOMY  1980   partial    There were no vitals filed for this visit.  Subjective Assessment - 10/09/18 0926    Subjective  Patient reports improvement in pain and has not had pain since taking the meloxicam. Patient states she is currently going to the hospital to work out.     Pertinent History  Pt reports insidious flare of Right hip pain in August, mostly noted with sitting and lying at night, achy in nature. Pt reports pain to be at the posterolateral gluteal area down to the mid thigh. Pt reports pain eventually resolved after taking melxicam. Pt was not aggravated by exercise. Pt reports regular exercise in a group setting 3-4x/week at [facility]. Pt reports a similar issue in 2016 and had some success working with Su Hoff. Pt reports 2014 anterior THA at [facility].      How long can you sit comfortably?  None     How long can you stand comfortably?  None     How long can you walk comfortably?  None     Diagnostic tests  Xray in Sept 2019 c Aluisio in [place], everything intact.     Patient Stated Goals   Continue exercise class, prevent future exacerbation.     Currently in Pain?  No/denies       TREATMENT Therapeutic Exercise Bridges on physioball - 2 x 15 Bridges with feet on ground with RTB - x 15 Hip abduction at hip machine - 2 x 15 40# Hip Extension at hip machine - 2 x 15 85# Squats in standing - x20 with UE support; x20 with 10# weight Running man with furniture slider - 2 x 20 Single leg squat with UE support in doorframe - 2 x 18  Patient demonstrates increased fatigue at the end of the session      PT Education - 10/09/18 0931    Education provided  Yes    Education Details  form/technique with exercise     Person(s) Educated  Patient    Methods  Explanation;Demonstration    Comprehension  Verbalized understanding;Returned demonstration          PT Long Term Goals - 09/21/18 0938      PT LONG TERM GOAL #1   Title  Pt will demonstrat eindependence with advanced HEP for DC with knowledge of how to progress resistance as needed.     Time  8    Period  Weeks    Status  New      PT LONG TERM GOAL #2   Title  Pt will demonstrate 4/5 or greater strength in prone hip extension, and 4+/5 seated Rt hip External ROtation.     Time  8    Period  Weeks    Status  New      PT LONG TERM GOAL #3   Title  Pt will report no decreased pain in anterior thigh, <2x/week, and absence of posterolater hip pain ion Right.     Time  8    Period  Weeks    Status  New            Plan - 10/09/18 0933    Clinical Impression Statement  Focused on performing hip stabilization excerises and patient demonstrates increased weakness along hip musculature. Also focused on performing exercises in standing for better carryover into functional activities. Patient demonstrates no increased pain throuhgout the session. Patient will benefit from furhter skilled therapy to progress exercises to maintain decrease in pain and return to prior level of function.     Rehab Potential   Excellent    Clinical Impairments Affecting Rehab Potential  Unclear of any recent joint effusion that may have been facilitating neurological inhibition of the hip.     PT Frequency  1x / week    PT Duration  8 weeks    PT Treatment/Interventions  Dry needling;Therapeutic exercise;Manual techniques;Functional mobility training;Moist Heat;Electrical Stimulation;Patient/family education    PT Next Visit Plan  Gait assessment, expand HEP, review goals, trial dry needling and/or MFR and stretching to quads    PT Home Exercise Plan  Glute max bridge with band abduction 1x15 BID    Consulted and Agree with Plan of Care  Patient       Patient will benefit from skilled therapeutic intervention in order to improve the following deficits and impairments:  Pain, Decreased strength, Improper body mechanics, Decreased activity tolerance, Impaired flexibility  Visit Diagnosis: Muscle weakness (generalized)  Pain in right hip  Other muscle spasm     Problem List Patient Active Problem List   Diagnosis Date Noted  . Abdominal aortic ectasia (HCC) 10/14/2016  . Hyperlipidemia 10/14/2016  . Intermittent asthma 10/14/2016  . Osteoarthritis of right hip 10/14/2016    Myrene Galas, PT DPT 10/09/2018, 9:46 AM  [place] [facility] PHYSICAL AND SPORTS MEDICINE [address] [place], [address] Phone: [phone]   Fax:  [phone]  Name: Melissa Wheeler MRN: [id number] Date of Birth: [birth year]

## 2018-10-16 ENCOUNTER — Ambulatory Visit: Payer: Medicare Other

## 2018-10-16 DIAGNOSIS — M25551 Pain in right hip: Secondary | ICD-10-CM

## 2018-10-16 DIAGNOSIS — M62838 Other muscle spasm: Secondary | ICD-10-CM

## 2018-10-16 DIAGNOSIS — M6281 Muscle weakness (generalized): Secondary | ICD-10-CM | POA: Diagnosis not present

## 2018-10-16 NOTE — Therapy (Signed)
[place] PHYSICAL AND SPORTS MEDICINE [address] [place], [address] Phone: [phone]   Fax:  [phone]  Physical Therapy Treatment  Patient Details  Name: Melissa Wheeler MRN: [id number] Date of Birth: 01/09/1951 Referring Provider (PT): Aluisio    Encounter Date: 10/16/2018  PT End of Session - 10/16/18 1030    Visit Number  3    Number of Visits  8    Date for PT Re-Evaluation  11/21/18    Authorization Type  Medicare     PT Start Time  0900    PT Stop Time  0938    PT Time Calculation (min)  38 min    Activity Tolerance  Patient tolerated treatment well    Behavior During Therapy  [facility] for tasks assessed/performed       Past Medical History:  Diagnosis Date  . Hyperlipidemia     Past Surgical History:  Procedure Laterality Date  . BREAST EXCISIONAL BIOPSY Left 1980   x 2 benign  . HIP SURGERY Right   . VAGINAL HYSTERECTOMY  1980   partial    There were no vitals filed for this visit.  Subjective Assessment - 10/16/18 1028    Subjective  Patient reports she has been working out with an exercise program at the hospital and has not felt pain for the past two weeks. Patient reports she is prepared for discharge.     Pertinent History  Pt reports insidious flare of Right hip pain in August, mostly noted with sitting and lying at night, achy in nature. Pt reports pain to be at the posterolateral gluteal area down to the mid thigh. Pt reports pain eventually resolved after taking melxicam. Pt was not aggravated by exercise. Pt reports regular exercise in a group setting 3-4x/week at [facility]. Pt reports a similar issue in 2016 and had some success working with Mont Dutton. Pt reports 2014 anterior THA at [facility].      How long can you sit comfortably?  None     How long can you stand comfortably?  None     How long can you walk comfortably?  None     Diagnostic tests  Xray in Sept 2019 c Aluisio in [place], everything  intact.     Patient Stated Goals  Continue exercise class, prevent future exacerbation.     Currently in Pain?  No/denies       TREATMENT Therapeutic Exercise all exercises performed B Lateral lunge -- 2 x 15 Forward lunge -- 2 x 15 Single leg squat with UE suppport --  2 x  15 Running man in standing -- 2 x 15 with UE support Bridges on physioball -- 2 x 15  Hip abduction with GTB -- 2 x 15   Patient demonstrates increased fatigue at the end of the session    PT Education - 10/16/18 1030    Education provided  Yes    Education Details  form/technique with exercise    Person(s) Educated  Patient    Methods  Explanation;Demonstration    Comprehension  Verbalized understanding;Returned demonstration          PT Long Term Goals - 10/16/18 1230      PT LONG TERM GOAL #1   Title  Pt will demonstrat eindependence with advanced HEP for DC with knowledge of how to progress resistance as needed.     Baseline  independent with HEP    Time  8  Period  Weeks    Status  Achieved      PT LONG TERM GOAL #2   Title  Pt will demonstrate 4/5 or greater strength in prone hip extension, and 4+/5 seated Rt hip External ROtation.     Baseline  4+/5 or above for all observed Hip strengthening from exercising    Time  8    Period  Weeks    Status  Achieved      PT LONG TERM GOAL #3   Title  Pt will report no decreased pain in anterior thigh, <2x/week, and absence of posterolater hip pain ion Right.     Baseline  No increase in pain for 2 weeks    Time  8    Period  Weeks    Status  Achieved            Plan - 10/16/18 1229    Clinical Impression Statement  Patient has met or partially met all long term goals and has not experienced any increase in pain for the past 2 weeks indicating functional carryover between sessions. Patient required moderate amount of cueing to perform exercises requiring verbal cueing to correct. Patient is acheived her goals and is to be discharged from  physical therapy.     Rehab Potential  Excellent    Clinical Impairments Affecting Rehab Potential  Unclear of any recent joint effusion that may have been facilitating neurological inhibition of the hip.     PT Frequency  1x / week    PT Duration  8 weeks    PT Treatment/Interventions  Dry needling;Therapeutic exercise;Manual techniques;Functional mobility training;Moist Heat;Electrical Stimulation;Patient/family education    PT Next Visit Plan  Gait assessment, expand HEP, review goals, trial dry needling and/or MFR and stretching to quads    PT Home Exercise Plan  Glute max bridge with band abduction 1x15 BID    Consulted and Agree with Plan of Care  Patient       Patient will benefit from skilled therapeutic intervention in order to improve the following deficits and impairments:  Pain, Decreased strength, Improper body mechanics, Decreased activity tolerance, Impaired flexibility  Visit Diagnosis: Muscle weakness (generalized)  Pain in right hip  Other muscle spasm     Problem List Patient Active Problem List   Diagnosis Date Noted  . Abdominal aortic ectasia ([place]) 10/14/2016  . Hyperlipidemia 10/14/2016  . Intermittent asthma 10/14/2016  . Osteoarthritis of right hip 10/14/2016    Blythe Stanford, PT DPT 10/16/2018, 12:40 PM  [place] PHYSICAL AND SPORTS MEDICINE [address] [place], [address] Phone: [phone]   Fax:  [phone]  Name: Melissa Wheeler MRN: [id number] Date of Birth: 10-17-1951

## 2018-10-23 ENCOUNTER — Ambulatory Visit: Payer: Medicare Other

## 2018-10-30 ENCOUNTER — Ambulatory Visit: Payer: Medicare Other

## 2019-02-27 ENCOUNTER — Other Ambulatory Visit: Payer: Self-pay | Admitting: Internal Medicine

## 2019-02-27 DIAGNOSIS — Z1231 Encounter for screening mammogram for malignant neoplasm of breast: Secondary | ICD-10-CM

## 2019-03-05 ENCOUNTER — Other Ambulatory Visit: Payer: Self-pay

## 2019-03-05 ENCOUNTER — Ambulatory Visit
Admission: RE | Admit: 2019-03-05 | Discharge: 2019-03-05 | Disposition: A | Discharge status: Home / Self Care | Payer: Medicare Other | Source: Ambulatory Visit | Attending: Internal Medicine | Admitting: Internal Medicine

## 2019-03-05 DIAGNOSIS — Z1231 Encounter for screening mammogram for malignant neoplasm of breast: Secondary | ICD-10-CM | POA: Insufficient documentation

## 2019-10-27 ENCOUNTER — Other Ambulatory Visit: Payer: Self-pay | Admitting: *Deleted

## 2019-10-27 DIAGNOSIS — Z20822 Contact with and (suspected) exposure to covid-19: Secondary | ICD-10-CM

## 2019-10-29 LAB — NOVEL CORONAVIRUS, NAA: SARS-CoV-2, NAA: NOT DETECTED

## 2019-11-28 ENCOUNTER — Other Ambulatory Visit: Payer: Self-pay

## 2019-11-28 DIAGNOSIS — Z20822 Contact with and (suspected) exposure to covid-19: Secondary | ICD-10-CM

## 2019-11-29 LAB — NOVEL CORONAVIRUS, NAA: SARS-CoV-2, NAA: NOT DETECTED

## 2020-01-10 ENCOUNTER — Ambulatory Visit: Payer: Medicare Other | Attending: Internal Medicine

## 2020-01-10 DIAGNOSIS — Z23 Encounter for immunization: Secondary | ICD-10-CM

## 2020-01-10 NOTE — Progress Notes (Signed)
   Covid-19 Vaccination Clinic  Name:  Melissa Wheeler    MRN: [id number] DOB: [birth year]  01/10/2020  [name] was observed post Covid-19 immunization for 15 minutes without incidence. She was provided with Vaccine Information Sheet and instruction to access the V-Safe system.   [name] was instructed to call 911 with any severe reactions post vaccine: Marland Kitchen Difficulty breathing  . Swelling of your face and throat  . A fast heartbeat  . A bad rash all over your body  . Dizziness and weakness    Immunizations Administered    Name Date Dose VIS Date Route   Pfizer COVID-19 Vaccine 01/10/2020  2:38 PM 0.3 mL 11/30/2019 Intramuscular   Manufacturer: [employer], [employer]   Lot: [id number]   NDC: [address]-4

## 2020-01-21 ENCOUNTER — Other Ambulatory Visit: Payer: Self-pay | Admitting: Internal Medicine

## 2020-01-21 DIAGNOSIS — Z1231 Encounter for screening mammogram for malignant neoplasm of breast: Secondary | ICD-10-CM

## 2020-01-28 ENCOUNTER — Ambulatory Visit: Payer: Medicare Other | Attending: Internal Medicine

## 2020-01-28 DIAGNOSIS — Z23 Encounter for immunization: Secondary | ICD-10-CM | POA: Insufficient documentation

## 2020-01-28 NOTE — Progress Notes (Signed)
   Covid-19 Vaccination Clinic  Name:  Melissa Wheeler    MRN: [id number] DOB: 08-18-1951  01/28/2020  [name] was observed post Covid-19 immunization for 15 minutes without incidence. She was provided with Vaccine Information Sheet and instruction to access the V-Safe system.   [name] was instructed to call 911 with any severe reactions post vaccine: Marland Kitchen Difficulty breathing  . Swelling of your face and throat  . A fast heartbeat  . A bad rash all over your body  . Dizziness and weakness    Immunizations Administered    Name Date Dose VIS Date Route   Pfizer COVID-19 Vaccine 01/28/2020  1:46 PM 0.3 mL 11/30/2019 Intramuscular   Manufacturer: [employer], [employer]   Lot: [id number]   NDC: [address]-5

## 2020-02-10 ENCOUNTER — Ambulatory Visit: Payer: Medicare Other

## 2020-03-25 ENCOUNTER — Ambulatory Visit
Admission: RE | Admit: 2020-03-25 | Discharge: 2020-03-25 | Disposition: A | Discharge status: Home / Self Care | Payer: Medicare Other | Source: Ambulatory Visit | Attending: Internal Medicine | Admitting: Internal Medicine

## 2020-03-25 DIAGNOSIS — Z1231 Encounter for screening mammogram for malignant neoplasm of breast: Secondary | ICD-10-CM | POA: Insufficient documentation

## 2021-02-23 ENCOUNTER — Other Ambulatory Visit: Payer: Self-pay | Admitting: Internal Medicine

## 2021-02-23 DIAGNOSIS — Z1231 Encounter for screening mammogram for malignant neoplasm of breast: Secondary | ICD-10-CM

## 2021-03-26 ENCOUNTER — Ambulatory Visit
Admission: RE | Admit: 2021-03-26 | Discharge: 2021-03-26 | Disposition: A | Discharge status: Home / Self Care | Payer: Medicare Other | Source: Ambulatory Visit | Attending: Internal Medicine | Admitting: Internal Medicine

## 2021-03-26 ENCOUNTER — Other Ambulatory Visit: Payer: Self-pay

## 2021-03-26 DIAGNOSIS — Z1231 Encounter for screening mammogram for malignant neoplasm of breast: Secondary | ICD-10-CM | POA: Insufficient documentation

## 2021-11-16 ENCOUNTER — Other Ambulatory Visit: Payer: Self-pay | Admitting: Internal Medicine

## 2021-11-16 DIAGNOSIS — Z13820 Encounter for screening for osteoporosis: Secondary | ICD-10-CM

## 2022-01-29 ENCOUNTER — Other Ambulatory Visit: Payer: Self-pay | Admitting: Internal Medicine

## 2022-01-29 DIAGNOSIS — Z1231 Encounter for screening mammogram for malignant neoplasm of breast: Secondary | ICD-10-CM

## 2022-04-28 ENCOUNTER — Ambulatory Visit
Admission: RE | Admit: 2022-04-28 | Discharge: 2022-04-28 | Disposition: A | Discharge status: Home / Self Care | Payer: Medicare Other | Source: Ambulatory Visit | Attending: Internal Medicine | Admitting: Internal Medicine

## 2022-04-28 DIAGNOSIS — Z1231 Encounter for screening mammogram for malignant neoplasm of breast: Secondary | ICD-10-CM

## 2022-04-28 DIAGNOSIS — Z13820 Encounter for screening for osteoporosis: Secondary | ICD-10-CM

## 2022-06-09 IMAGING — MG MM DIGITAL SCREENING BILAT W/ TOMO AND CAD
8 series · 9 of 24 positions shown · non-contrast
Comparison: Previous exam(s).

CLINICAL DATA: Screening.

EXAM:
DIGITAL SCREENING BILATERAL MAMMOGRAM WITH TOMOSYNTHESIS AND CAD
TECHNIQUE: Bilateral screening digital craniocaudal and mediolateral oblique
mammograms were obtained. Bilateral screening digital breast
tomosynthesis was performed. The images were evaluated with
computer-aided detection.

[L CC synth-2D]
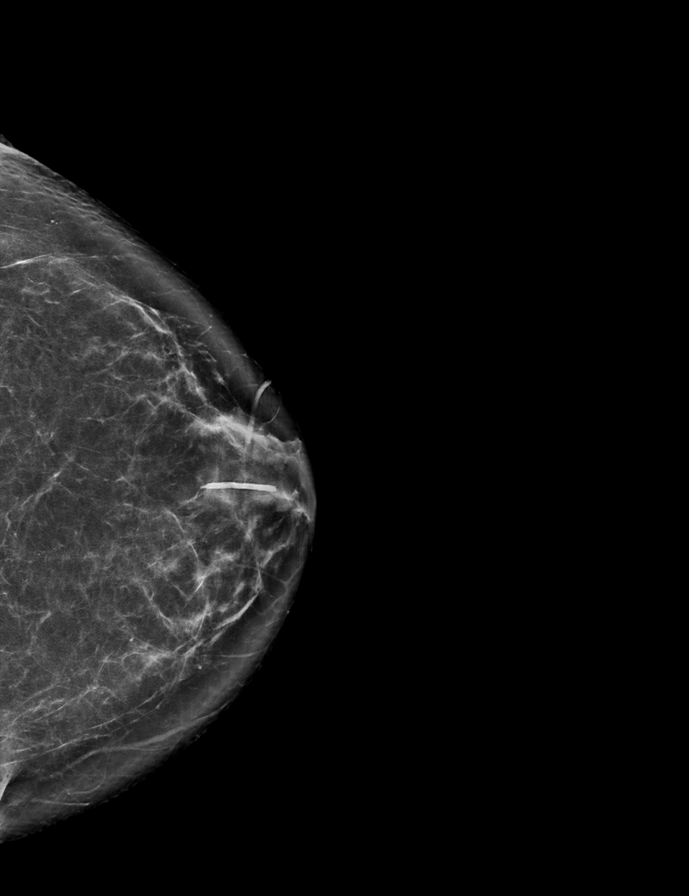

[L MLO synth-2D]
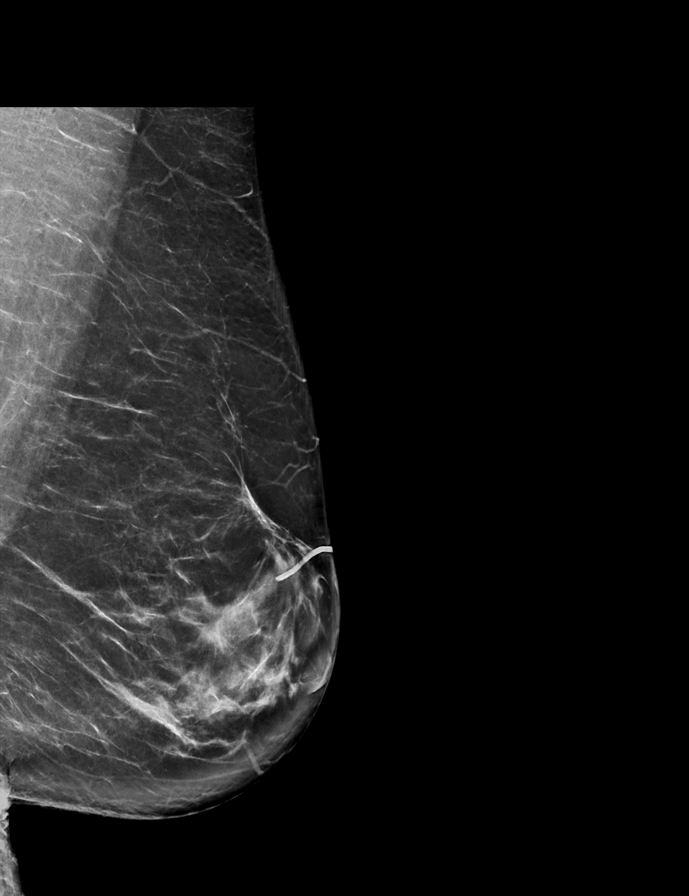

[R CC synth-2D]
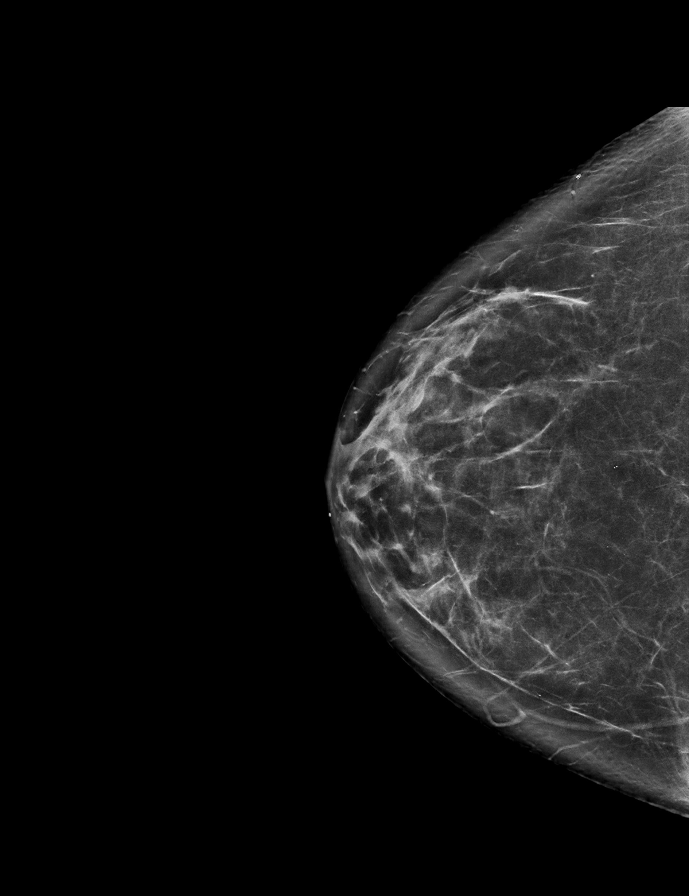

[R MLO synth-2D]
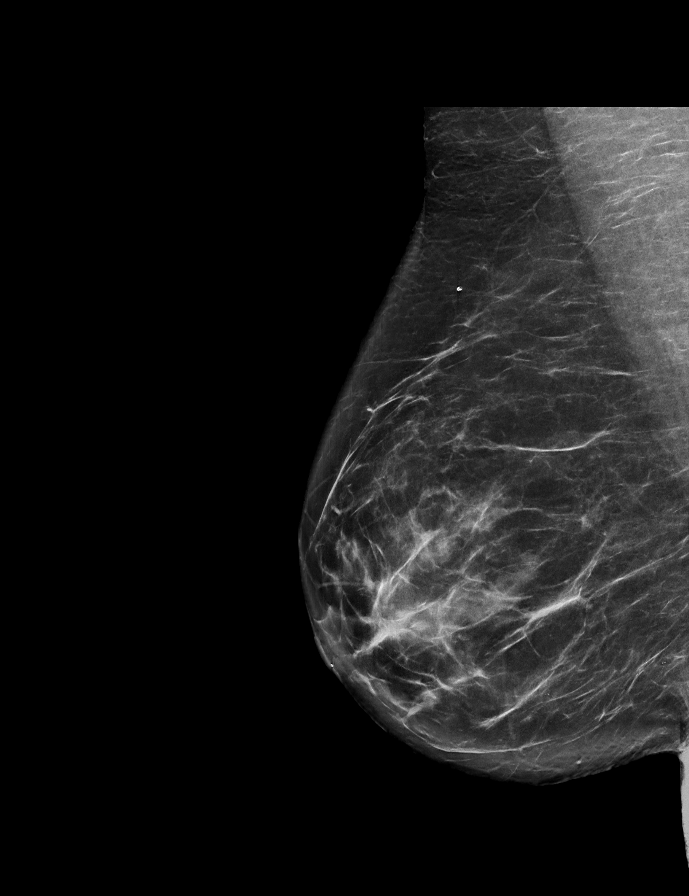

[L MLO tomo · 2 of 82 frames shown]
[frame 27/82]
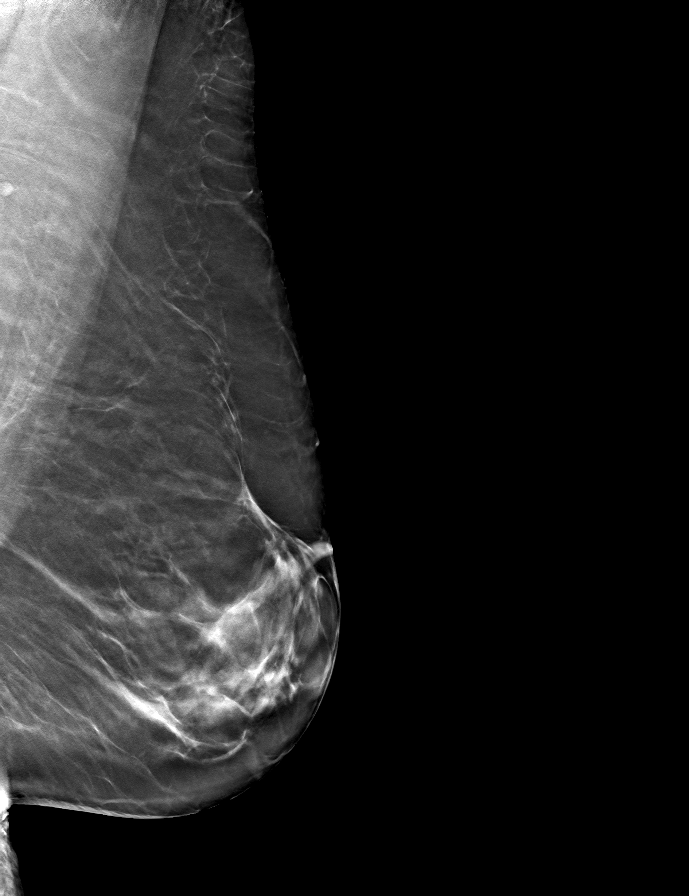
[frame 41/82]
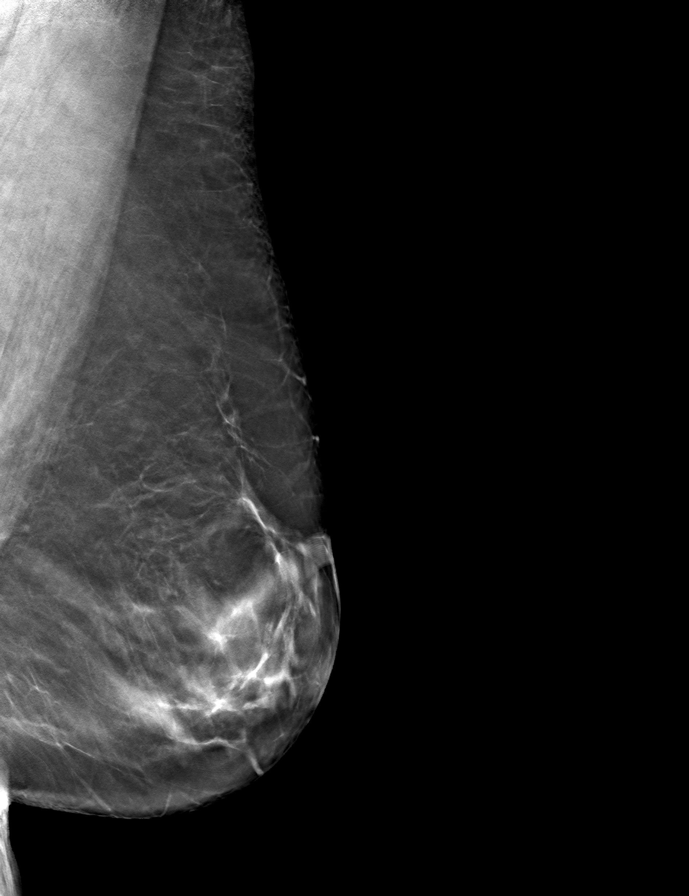

[R MLO tomo · tomo slice 40/79.0]
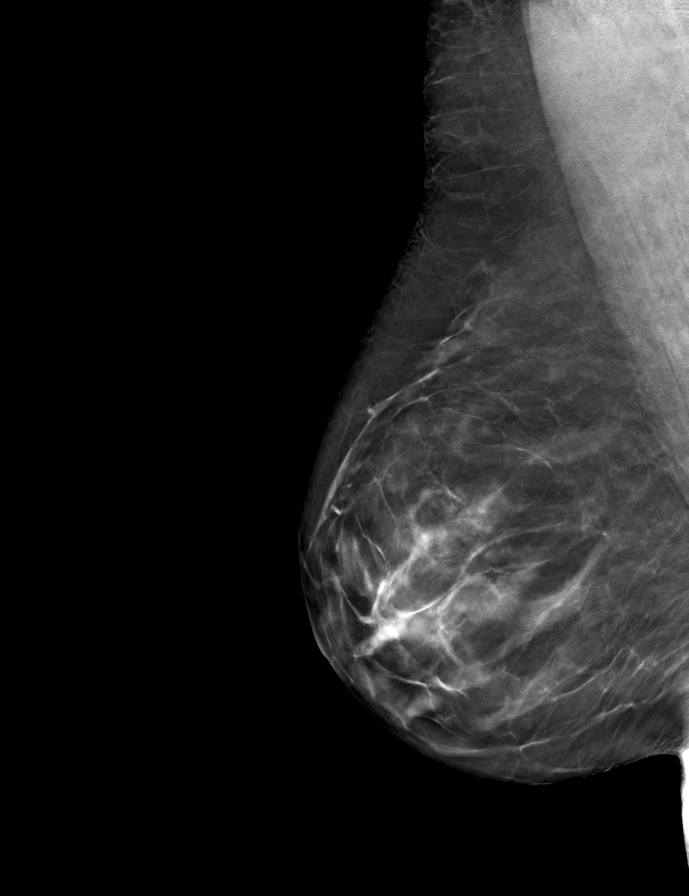

[L CC tomo · tomo slice 38/75.0]
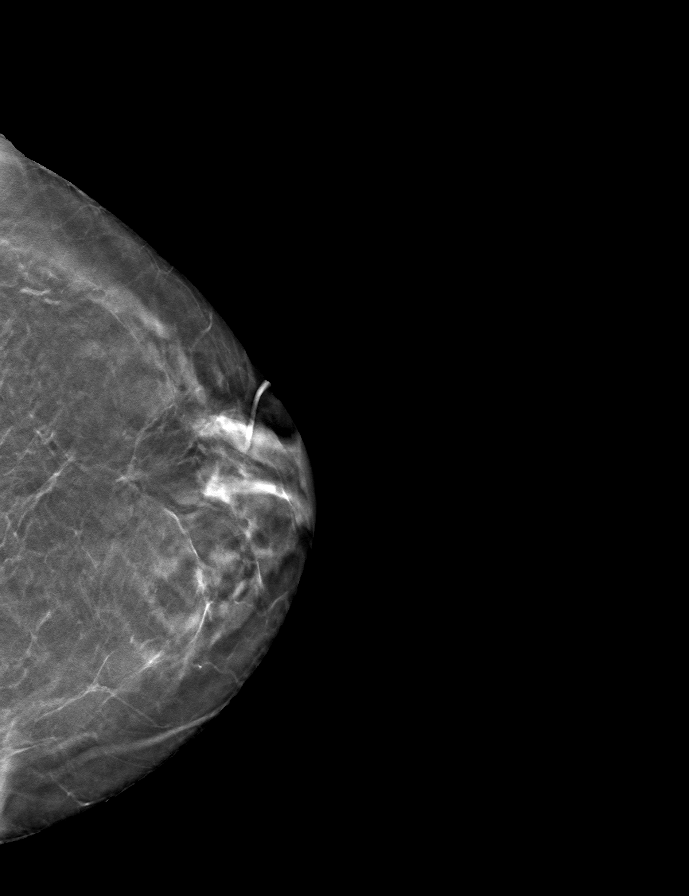

[R CC tomo · tomo slice 39/77.0]
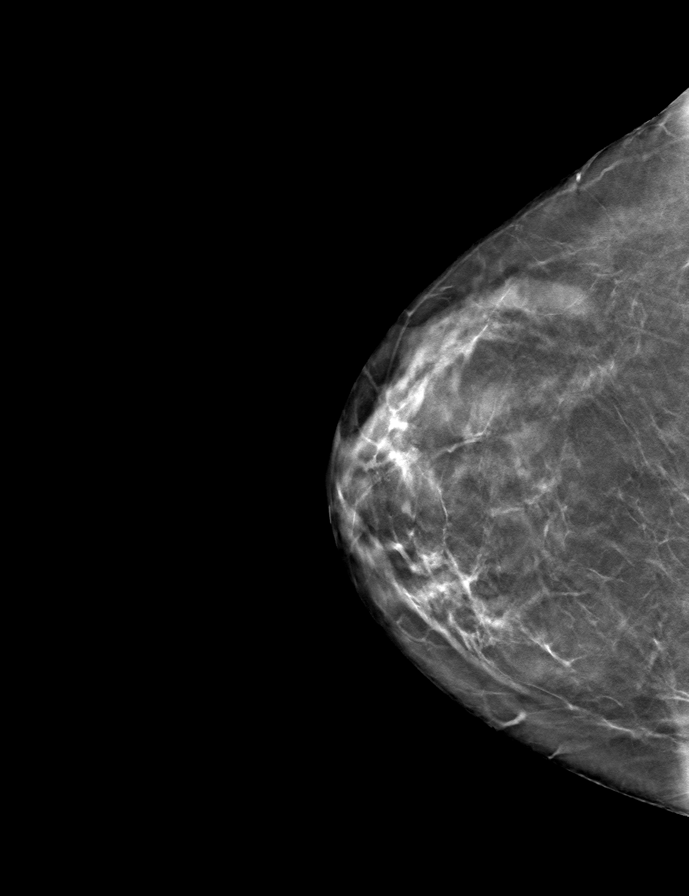

[9 of 24 positions shown; findings below may reference images not displayed]

ACR Breast Density Category b: There are scattered areas of
fibroglandular density.
FINDINGS: There are no findings suspicious for malignancy.
IMPRESSION: No mammographic evidence of malignancy. A result letter of this
screening mammogram will be mailed directly to the patient.

RECOMMENDATION:
Screening mammogram in one year. (Code:51-O-LD2)

BI-RADS CATEGORY  1: Negative.

## 2023-03-24 ENCOUNTER — Other Ambulatory Visit: Payer: Self-pay | Admitting: Internal Medicine

## 2023-03-24 DIAGNOSIS — Z1231 Encounter for screening mammogram for malignant neoplasm of breast: Secondary | ICD-10-CM

## 2023-05-06 ENCOUNTER — Ambulatory Visit
Admission: RE | Admit: 2023-05-06 | Discharge: 2023-05-06 | Disposition: A | Discharge status: Home / Self Care | Payer: Medicare Other | Source: Ambulatory Visit | Attending: Internal Medicine | Admitting: Internal Medicine

## 2023-05-06 DIAGNOSIS — Z1231 Encounter for screening mammogram for malignant neoplasm of breast: Secondary | ICD-10-CM

## 2024-03-02 ENCOUNTER — Other Ambulatory Visit: Payer: Self-pay | Admitting: Internal Medicine

## 2024-03-02 DIAGNOSIS — E785 Hyperlipidemia, unspecified: Secondary | ICD-10-CM

## 2024-03-08 ENCOUNTER — Ambulatory Visit
Admission: RE | Admit: 2024-03-08 | Discharge: 2024-03-08 | Disposition: A | Discharge status: Home / Self Care | Source: Ambulatory Visit | Attending: Internal Medicine | Admitting: Internal Medicine

## 2024-03-08 DIAGNOSIS — E785 Hyperlipidemia, unspecified: Secondary | ICD-10-CM

## 2024-03-22 ENCOUNTER — Other Ambulatory Visit: Payer: Self-pay | Admitting: Internal Medicine

## 2024-03-22 DIAGNOSIS — Z1231 Encounter for screening mammogram for malignant neoplasm of breast: Secondary | ICD-10-CM

## 2024-04-30 ENCOUNTER — Encounter (HOSPITAL_COMMUNITY): Payer: Self-pay

## 2024-05-07 ENCOUNTER — Ambulatory Visit
Admission: RE | Admit: 2024-05-07 | Discharge: 2024-05-07 | Disposition: A | Discharge status: Home / Self Care | Source: Ambulatory Visit | Attending: Internal Medicine | Admitting: Internal Medicine

## 2024-05-07 DIAGNOSIS — Z1231 Encounter for screening mammogram for malignant neoplasm of breast: Secondary | ICD-10-CM

## 2024-08-31 ENCOUNTER — Encounter: Payer: Self-pay | Admitting: Gastroenterology

## 2024-09-04 ENCOUNTER — Ambulatory Visit: Admitting: Gastroenterology

## 2024-09-04 ENCOUNTER — Encounter: Payer: Self-pay | Admitting: Gastroenterology

## 2024-09-04 VITALS — BP 120/68 | HR 63 | Ht 63.0 in | Wt 122.5 lb

## 2024-09-04 DIAGNOSIS — R1013 Epigastric pain: Secondary | ICD-10-CM

## 2024-09-04 NOTE — Patient Instructions (Signed)
 _______________________________________________________  If your blood pressure at your visit was 140/90 or greater, please contact your primary care physician to follow up on this.  _______________________________________________________  If you are age 74 or older, your body mass index should be between 23-30. Your Body mass index is 21.7 kg/m. If this is out of the aforementioned range listed, please consider follow up with your Primary Care Provider.  If you are age 49 or younger, your body mass index should be between 19-25. Your Body mass index is 21.7 kg/m. If this is out of the aformentioned range listed, please consider follow up with your Primary Care Provider.   ________________________________________________________  The [facility] providers would like to encourage you to use MYCHART to communicate with providers for non-urgent requests or questions.  Due to long hold times on the telephone, sending your provider a message by [facility] may be a faster and more efficient way to get a response.  Please allow 48 business hours for a response.  Please remember that this is for non-urgent requests.  _______________________________________________________  [facility] is using a team-based approach to care.  Your team is made up of your doctor and two to three APPS. Our APPS (Nurse Practitioners and Physician Assistants) work with your physician to ensure care continuity for you. They are fully qualified to address your health concerns and develop a treatment plan. They communicate directly with your gastroenterologist to care for you. Seeing the Advanced Practice Practitioners on your physician's team can help you by facilitating care more promptly, often allowing for earlier appointments, access to diagnostic testing, procedures, and other specialty referrals.   You have been scheduled for an abdominal ultrasound at [facility] (1st floor of hospital) on 09-11-24 at  8:30am. Please arrive 15 minutes prior to your appointment for registration. Make certain not to have anything to eat or drink 6 hours prior to your appointment. Should you need to reschedule your appointment, please contact radiology at [phone]. This test typically takes about 30 minutes to perform.  You have been scheduled for an endoscopy. Please follow written instructions given to you at your visit today.  If you use inhalers (even only as needed), please bring them with you on the day of your procedure.  If you take any of the following medications, they will need to be adjusted prior to your procedure:   DO NOT TAKE 7 DAYS PRIOR TO TEST- Trulicity (dulaglutide) Ozempic, Wegovy (semaglutide) Mounjaro (tirzepatide) Bydureon Bcise (exanatide extended release)  DO NOT TAKE 1 DAY PRIOR TO YOUR TEST Rybelsus (semaglutide) Adlyxin (lixisenatide) Victoza (liraglutide) Byetta (exanatide) ___________________________________________________________________________  Due to recent changes in healthcare laws, you may see the results of your imaging and laboratory studies on MyChart before your provider has had a chance to review them.  We understand that in some cases there may be results that are confusing or concerning to you. Not all laboratory results come back in the same time frame and the provider may be waiting for multiple results in order to interpret others.  Please give us  48 hours in order for your provider to thoroughly review all the results before contacting the office for clarification of your results.   Thank you for entrusting me with your care and choosing [facility].  Dr Charlanne

## 2024-09-04 NOTE — Progress Notes (Signed)
 Chief Complaint: abdo pain  Referring Provider:  Lenon Layman ORN, MD      ASSESSMENT AND PLAN;   #1. 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 Neg EGD/US /HIDA with EF 2015, failed levsin. Nl recent CBC, CMP, TSH 06/2024  #2. Neg colon Jan 2018.  No need to repeat d/t age.  Plan: -EGD -US  abdo -If still with problems, HIDA with EF followed by CT AP   HPI:    History of Present Illness Melissa Wheeler is a 73 year old female who presents with recurrent upper abdominal cramps.  Mostly episodic.  She experiences random abdominal cramps that began in the spring. Initially, the cramps were frequent, but after stopping milk consumption and taking probiotics, the symptoms subsided. However, in the last several weeks, the cramps have returned, occurring approximately once a week, typically in the morning from 7 to 11 AM, and resolving by late morning. No specific food triggers have been identified.  She recalls a similar episode about ten years ago, for which she underwent an endoscopy, ultrasound, and CCK HIDA test. At that time, she was prescribed Cymbalta, which she did not take, and Levsin, which was ineffective. The symptoms resolved on her own back then.  Her last episode occurred nine days ago while in church, and prior episodes occurred during vacation. The pain lasts for a few hours and resolves by late morning. She experiences two to three loose bowel movements during the episodes, which improve the symptoms. No recent weight loss or nervousness. No recent weight loss, blood in stool, dark stools, or food getting stuck when swallowing.  She has a history of a negative colonoscopy in 2018 and a routine ultrasound for AAA due to family history, which was normal. She takes Lipitor, a probiotic, Preservision, vitamin D, and uses Pepto-Bismol frequently. She has not noticed any constipation or passing of small balls in her stool prior to episodes, and the cramps do not  wake her from sleep. The cramps occur after breakfast and are not associated with heartburn. She describes the pain as intense, causing her to feel 'it just grabs me' and then resolves after several hours.  She denies having any jaundice dark urine or pale stools.  She denies having any melena or hematochezia.  No fever chills or night sweats.  No definite food triggers.    Past GI WU: Colon Jan 2018: neg. Rpt 10 yrs EGD 2015- neg US  2015- neg HIDA with EF- neg 2015 Past Medical History:  Diagnosis Date   Arthritis    Hyperlipidemia     Past Surgical History:  Procedure Laterality Date   BREAST EXCISIONAL BIOPSY Left 1980   x 2 benign   HIP SURGERY Right    VAGINAL HYSTERECTOMY  1980   partial    Family History  Problem Relation Age of Onset   AAA (abdominal aortic aneurysm) Mother    Hyperlipidemia Mother    Heart disease Mother    Hyperlipidemia Father    Heart disease Father    Breast cancer Neg Hx    Colon cancer Neg Hx    Colon polyps Neg Hx    Esophageal cancer Neg Hx    Pancreatic cancer Neg Hx    Stomach cancer Neg Hx     Social History   Tobacco Use   Smoking status: Never   Smokeless tobacco: Never  Vaping Use   Vaping status: Never Used  Substance Use Topics   Alcohol use: Yes  Alcohol/week: 7.0 standard drinks of alcohol    Types: 7 Glasses of wine per week    Comment: 1 glass of wine each night   Drug use: No    Current Outpatient Medications  Medication Sig Dispense Refill   atorvastatin (LIPITOR) 20 MG tablet Take by mouth.     Calcium Carbonate-Vit D-Min (CALCIUM 1200 PO) Take by mouth.     No current facility-administered medications for this visit.    Allergies  Allergen Reactions   Promethazine Other (See Comments)    Tongue swelling, disorientation     Review of Systems:  Constitutional: Denies fever, chills, diaphoresis, appetite change and fatigue.  HEENT: Denies photophobia, eye pain, redness, hearing loss, ear pain,  congestion, sore throat, rhinorrhea, sneezing, mouth sores, neck pain, neck stiffness and tinnitus.   Respiratory: Denies SOB, DOE, cough, chest tightness,  and wheezing.   Cardiovascular: Denies chest pain, palpitations and leg swelling.  Genitourinary: Denies dysuria, urgency, frequency, hematuria, flank pain and difficulty urinating.  Musculoskeletal: Denies myalgias, back pain, joint swelling, arthralgias and gait problem.  Skin: No rash.  Neurological: Denies dizziness, seizures, syncope, weakness, light-headedness, numbness and headaches.  Hematological: Denies adenopathy. Easy bruising, personal or family bleeding history  Psychiatric/Behavioral: No anxiety or depression     Physical Exam:    BP 120/68 (BP Location: Left Arm, Patient Position: Sitting, Cuff Size: Normal)   Pulse 63   Ht 5' 3 (1.6 m)   Wt 122 lb 8 oz (55.6 kg)   BMI 21.70 kg/m  Wt Readings from Last 3 Encounters:  09/04/24 122 lb 8 oz (55.6 kg)  10/14/16 132 lb (59.9 kg)   Constitutional:  Well-developed, in no acute distress. Psychiatric: Normal mood and affect. Behavior is normal. HEENT: Pupils normal.  Conjunctivae are normal. No scleral icterus. Neck supple.  Cardiovascular: Normal rate, regular rhythm. No edema Pulmonary/chest: Effort normal and breath sounds normal. No wheezing, rales or rhonchi. Abdominal: Soft, nondistended. Nontender. Bowel sounds active throughout. There are no masses palpable. No hepatomegaly. Rectal: Deferred Neurological: Alert and oriented to person place and time. Skin: Skin is warm and dry. No rashes noted.  Data Reviewed: I have personally reviewed following labs and imaging studies  CBC:    Latest Ref Rng & Units 09/09/2013   11:54 AM  CBC  WBC 3.6 - 11.0 x10 3/mm 3 10.0   Hemoglobin 12.0 - 16.0 g/dL 87.8   Hematocrit 64.9 - 47.0 % 36.1   Platelets 150 - 440 x10 3/mm 3 387     CMP:    Latest Ref Rng & Units 09/09/2013   11:54 AM  CMP  Glucose 65 - 99 mg/dL  878   BUN 7 - 18 mg/dL 10   Creatinine 9.39 - 1.30 mg/dL 8.97   Sodium 863 - 854 mmol/L 138   Potassium 3.5 - 5.1 mmol/L 3.5   Chloride 98 - 107 mmol/L 105   CO2 21 - 32 mmol/L 27   Calcium 8.5 - 10.1 mg/dL 9.3   Total Protein 6.4 - 8.2 g/dL 7.3   Total Bilirubin 0.2 - 1.0 mg/dL 0.2   Alkaline Phos 50 - 136 Unit/L 66   AST 15 - 37 Unit/L 22   ALT 12 - 78 U/L 16     GFR: CrCl cannot be calculated (Patient's most recent lab result is older than the maximum 21 days allowed.). Liver Function Tests: No results for input(s): AST, ALT, ALKPHOS, BILITOT, PROT, ALBUMIN in the last 168 hours. No results for  input(s): LIPASE, AMYLASE in the last 168 hours. No results for input(s): AMMONIA in the last 168 hours. Coagulation Profile: No results for input(s): INR, PROTIME in the last 168 hours. HbA1C: No results for input(s): HGBA1C in the last 72 hours. Lipid Profile: No results for input(s): CHOL, HDL, LDLCALC, TRIG, CHOLHDL, LDLDIRECT in the last 72 hours. Thyroid Function Tests: No results for input(s): TSH, T4TOTAL, FREET4, T3FREE, THYROIDAB in the last 72 hours. Anemia Panel: No results for input(s): VITAMINB12, FOLATE, FERRITIN, TIBC, IRON, RETICCTPCT in the last 72 hours.  No results found for this or any previous visit (from the past 240 hours).    Radiology Studies: No results found.    Anselm Bring, MD 09/04/2024, 10:37 AM  Cc: Lenon Layman ORN, MD

## 2024-09-11 ENCOUNTER — Ambulatory Visit (HOSPITAL_COMMUNITY)
Admission: RE | Admit: 2024-09-11 | Discharge: 2024-09-11 | Disposition: A | Discharge status: Home / Self Care | Source: Ambulatory Visit | Attending: Gastroenterology | Admitting: Gastroenterology

## 2024-09-11 DIAGNOSIS — R1013 Epigastric pain: Secondary | ICD-10-CM | POA: Insufficient documentation

## 2024-09-17 ENCOUNTER — Ambulatory Visit: Payer: Self-pay | Admitting: Gastroenterology

## 2024-10-24 ENCOUNTER — Ambulatory Visit (AMBULATORY_SURGERY_CENTER): Admitting: Gastroenterology

## 2024-10-24 ENCOUNTER — Encounter: Payer: Self-pay | Admitting: Gastroenterology

## 2024-10-24 VITALS — BP 109/71 | HR 58 | Temp 97.3°F | Resp 20 | Ht 63.0 in | Wt 122.0 lb

## 2024-10-24 DIAGNOSIS — Q399 Congenital malformation of esophagus, unspecified: Secondary | ICD-10-CM

## 2024-10-24 DIAGNOSIS — R1013 Epigastric pain: Secondary | ICD-10-CM

## 2024-10-24 DIAGNOSIS — K2950 Unspecified chronic gastritis without bleeding: Secondary | ICD-10-CM | POA: Diagnosis not present

## 2024-10-24 MED ORDER — SODIUM CHLORIDE 0.9 % IV SOLN: 500.0000 mL | Freq: Once | INTRAVENOUS | Status: AC

## 2024-10-24 NOTE — Progress Notes (Signed)
 Chief Complaint: abdo pain  Referring Provider:  Lenon Layman ORN, MD      ASSESSMENT AND PLAN;   #1. 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 Neg EGD/US /HIDA with EF 2015, failed levsin. Nl recent CBC, CMP, TSH 06/2024  #2. Neg colon Jan 2018.  No need to repeat d/t age.  Plan: -EGD -US  abdo -If still with problems, HIDA with EF followed by CT AP   US - neg For EGD today  HPI:    History of Present Illness Melissa Wheeler is a 73 year old female who presents with recurrent upper abdominal cramps.  Mostly episodic.  She experiences random abdominal cramps that began in the spring. Initially, the cramps were frequent, but after stopping milk consumption and taking probiotics, the symptoms subsided. However, in the last several weeks, the cramps have returned, occurring approximately once a week, typically in the morning from 7 to 11 AM, and resolving by late morning. No specific food triggers have been identified.  She recalls a similar episode about ten years ago, for which she underwent an endoscopy, ultrasound, and CCK HIDA test. At that time, she was prescribed Cymbalta, which she did not take, and Levsin, which was ineffective. The symptoms resolved on her own back then.  Her last episode occurred nine days ago while in church, and prior episodes occurred during vacation. The pain lasts for a few hours and resolves by late morning. She experiences two to three loose bowel movements during the episodes, which improve the symptoms. No recent weight loss or nervousness. No recent weight loss, blood in stool, dark stools, or food getting stuck when swallowing.  She has a history of a negative colonoscopy in 2018 and a routine ultrasound for AAA due to family history, which was normal. She takes Lipitor, a probiotic, Preservision, vitamin D, and uses Pepto-Bismol frequently. She has not noticed any constipation or passing of small balls in her stool prior to episodes,  and the cramps do not wake her from sleep. The cramps occur after breakfast and are not associated with heartburn. She describes the pain as intense, causing her to feel 'it just grabs me' and then resolves after several hours.  She denies having any jaundice dark urine or pale stools.  She denies having any melena or hematochezia.  No fever chills or night sweats.  No definite food triggers.    Past GI WU: Colon Jan 2018: neg. Rpt 10 yrs EGD 2015- neg US  2015- neg HIDA with EF- neg 2015 Past Medical History:  Diagnosis Date   Arthritis    Hyperlipidemia     Past Surgical History:  Procedure Laterality Date   BREAST EXCISIONAL BIOPSY Left 1980   x 2 benign   HIP SURGERY Right    VAGINAL HYSTERECTOMY  1980   partial    Family History  Problem Relation Age of Onset   AAA (abdominal aortic aneurysm) Mother    Hyperlipidemia Mother    Heart disease Mother    Hyperlipidemia Father    Heart disease Father    Breast cancer Neg Hx    Colon cancer Neg Hx    Colon polyps Neg Hx    Esophageal cancer Neg Hx    Pancreatic cancer Neg Hx    Stomach cancer Neg Hx     Social History   Tobacco Use   Smoking status: Never   Smokeless tobacco: Never  Vaping Use   Vaping status: Never Used  Substance Use Topics  Alcohol use: Yes    Alcohol/week: 7.0 standard drinks of alcohol    Types: 7 Glasses of wine per week    Comment: 1 glass of wine each night   Drug use: No    Current Outpatient Medications  Medication Sig Dispense Refill   atorvastatin (LIPITOR) 20 MG tablet Take by mouth.     cholecalciferol (VITAMIN D3) 25 MCG (1000 UNIT) tablet Take 1,000 Units by mouth daily.     meloxicam (MOBIC) 7.5 MG tablet Take 7.5 mg by mouth daily as needed.     zolpidem (AMBIEN) 5 MG tablet Take 5 mg by mouth at bedtime as needed.     Current Facility-Administered Medications  Medication Dose Route Frequency Provider Last Rate Last Admin   0.9 %  sodium chloride infusion  500 mL  Intravenous Once Phillis Thackeray, MD        Allergies  Allergen Reactions   Promethazine Other (See Comments)    Tongue swelling, disorientation    Fexofenadine Swelling and Other (See Comments)    Facial Flushing    Review of Systems:  Constitutional: Denies fever, chills, diaphoresis, appetite change and fatigue.  HEENT: Denies photophobia, eye pain, redness, hearing loss, ear pain, congestion, sore throat, rhinorrhea, sneezing, mouth sores, neck pain, neck stiffness and tinnitus.   Respiratory: Denies SOB, DOE, cough, chest tightness,  and wheezing.   Cardiovascular: Denies chest pain, palpitations and leg swelling.  Genitourinary: Denies dysuria, urgency, frequency, hematuria, flank pain and difficulty urinating.  Musculoskeletal: Denies myalgias, back pain, joint swelling, arthralgias and gait problem.  Skin: No rash.  Neurological: Denies dizziness, seizures, syncope, weakness, light-headedness, numbness and headaches.  Hematological: Denies adenopathy. Easy bruising, personal or family bleeding history  Psychiatric/Behavioral: No anxiety or depression     Physical Exam:    BP 115/73   Pulse 65   Temp (!) 97.3 F (36.3 C) (Skin)   Ht 5' 3 (1.6 m)   Wt 122 lb (55.3 kg)   SpO2 98%   BMI 21.61 kg/m  Wt Readings from Last 3 Encounters:  10/24/24 122 lb (55.3 kg)  09/04/24 122 lb 8 oz (55.6 kg)  10/14/16 132 lb (59.9 kg)   Constitutional:  Well-developed, in no acute distress. Psychiatric: Normal mood and affect. Behavior is normal. HEENT: Pupils normal.  Conjunctivae are normal. No scleral icterus. Neck supple.  Cardiovascular: Normal rate, regular rhythm. No edema Pulmonary/chest: Effort normal and breath sounds normal. No wheezing, rales or rhonchi. Abdominal: Soft, nondistended. Nontender. Bowel sounds active throughout. There are no masses palpable. No hepatomegaly. Rectal: Deferred Neurological: Alert and oriented to person place and time. Skin: Skin is warm  and dry. No rashes noted.  Data Reviewed: I have personally reviewed following labs and imaging studies  CBC:    Latest Ref Rng & Units 09/09/2013   11:54 AM  CBC  WBC 3.6 - 11.0 x10 3/mm 3 10.0   Hemoglobin 12.0 - 16.0 g/dL 87.8   Hematocrit 64.9 - 47.0 % 36.1   Platelets 150 - 440 x10 3/mm 3 387     CMP:    Latest Ref Rng & Units 09/09/2013   11:54 AM  CMP  Glucose 65 - 99 mg/dL 878   BUN 7 - 18 mg/dL 10   Creatinine 9.39 - 1.30 mg/dL 8.97   Sodium 863 - 854 mmol/L 138   Potassium 3.5 - 5.1 mmol/L 3.5   Chloride 98 - 107 mmol/L 105   CO2 21 - 32 mmol/L 27  Calcium 8.5 - 10.1 mg/dL 9.3   Total Protein 6.4 - 8.2 g/dL 7.3   Total Bilirubin 0.2 - 1.0 mg/dL 0.2   Alkaline Phos 50 - 136 Unit/L 66   AST 15 - 37 Unit/L 22   ALT 12 - 78 U/L 16     GFR: CrCl cannot be calculated (Patient's most recent lab result is older than the maximum 21 days allowed.). Liver Function Tests: No results for input(s): AST, ALT, ALKPHOS, BILITOT, PROT, ALBUMIN in the last 168 hours. No results for input(s): LIPASE, AMYLASE in the last 168 hours. No results for input(s): AMMONIA in the last 168 hours. Coagulation Profile: No results for input(s): INR, PROTIME in the last 168 hours. HbA1C: No results for input(s): HGBA1C in the last 72 hours. Lipid Profile: No results for input(s): CHOL, HDL, LDLCALC, TRIG, CHOLHDL, LDLDIRECT in the last 72 hours. Thyroid Function Tests: No results for input(s): TSH, T4TOTAL, FREET4, T3FREE, THYROIDAB in the last 72 hours. Anemia Panel: No results for input(s): VITAMINB12, FOLATE, FERRITIN, TIBC, IRON, RETICCTPCT in the last 72 hours.  No results found for this or any previous visit (from the past 240 hours).    Radiology Studies: No results found.    Anselm Bring, MD 10/24/2024, 8:32 AM  Cc: Lenon Layman ORN, MD

## 2024-10-24 NOTE — Progress Notes (Signed)
 Sedate, gd SR, tolerated procedure well, VSS, report to RN

## 2024-10-24 NOTE — Progress Notes (Signed)
 Called to room to assist during endoscopic procedure.  Patient ID and intended procedure confirmed with present staff. Received instructions for my participation in the procedure from the performing physician.

## 2024-10-24 NOTE — Patient Instructions (Signed)
 Please read handouts provided. Continue present medications. Await pathology results. Resume previous diet.   YOU HAD AN ENDOSCOPIC PROCEDURE TODAY AT THE [facility]:   Refer to the procedure report that was given to you for any specific questions about what was found during the examination.  If the procedure report does not answer your questions, please call your gastroenterologist to clarify.  If you requested that your care partner not be given the details of your procedure findings, then the procedure report has been included in a sealed envelope for you to review at your convenience later.  YOU SHOULD EXPECT: Some feelings of bloating in the abdomen. Passage of more gas than usual.  Walking can help get rid of the air that was put into your GI tract during the procedure and reduce the bloating. If you had a lower endoscopy (such as a colonoscopy or flexible sigmoidoscopy) you may notice spotting of blood in your stool or on the toilet paper. If you underwent a bowel prep for your procedure, you may not have a normal bowel movement for a few days.  Please Note:  You might notice some irritation and congestion in your nose or some drainage.  This is from the oxygen used during your procedure.  There is no need for concern and it should clear up in a day or so.  SYMPTOMS TO REPORT IMMEDIATELY:  Following upper endoscopy (EGD)  Vomiting of blood or coffee ground material  New chest pain or pain under the shoulder blades  Painful or persistently difficult swallowing  New shortness of breath  Fever of 100F or higher  Black, tarry-looking stools  For urgent or emergent issues, a gastroenterologist can be reached at any hour by calling [phone]. Do not use MyChart messaging for urgent concerns.    DIET:  We do recommend a small meal at first, but then you may proceed to your regular diet.  Drink plenty of fluids but you should avoid alcoholic beverages for 24  hours.  ACTIVITY:  You should plan to take it easy for the rest of today and you should NOT DRIVE or use heavy machinery until tomorrow (because of the sedation medicines used during the test).    FOLLOW UP: Our staff will call the number listed on your records the next business day following your procedure.  We will call around 7:15- 8:00 am to check on you and address any questions or concerns that you may have regarding the information given to you following your procedure. If we do not reach you, we will leave a message.     If any biopsies were taken you will be contacted by phone or by letter within the next 1-3 weeks.  Please call us at [phone] if you have not heard about the biopsies in 3 weeks.    SIGNATURES/CONFIDENTIALITY: You and/or your care partner have signed paperwork which will be entered into your electronic medical record.  These signatures attest to the fact that that the information above on your After Visit Summary has been reviewed and is understood.  Full responsibility of the confidentiality of this discharge information lies with you and/or your care-partner.

## 2024-10-24 NOTE — Progress Notes (Signed)
 Pt's states no medical or surgical changes since previsit or office visit.

## 2024-10-24 NOTE — Op Note (Addendum)
 [facility] Patient Name: Melissa Wheeler Procedure Date: 10/24/2024 8:29 AM MRN: [id number] Endoscopist: Lynnie Bring , MD, [id number] Age: 73 Referring MD:  Date of Birth: 17-Feb-1951 Gender: Female Account #: [id number] Procedure:                Upper GI endoscopy Indications:              Epigastric abdominal pain Medicines:                Monitored Anesthesia Care Procedure:                Pre-Anesthesia Assessment:                           - Prior to the procedure, a History and Physical                            was performed, and patient medications and                            allergies were reviewed. The patient's tolerance of                            previous anesthesia was also reviewed. The risks                            and benefits of the procedure and the sedation                            options and risks were discussed with the patient.                            All questions were answered, and informed consent                            was obtained. Prior Anticoagulants: The patient has                            taken no anticoagulant or antiplatelet agents. ASA                            Grade Assessment: II - A patient with mild systemic                            disease. After reviewing the risks and benefits,                            the patient was deemed in satisfactory condition to                            undergo the procedure.                           After obtaining informed consent, the endoscope was  passed under direct vision. Throughout the                            procedure, the patient's blood pressure, pulse, and                            oxygen saturations were monitored continuously. The                            GIF [id number] #[id number] was introduced through the                            mouth, and advanced to the second part of duodenum.                            The upper GI endoscopy was  accomplished without                            difficulty. The patient tolerated the procedure                            well. Scope In: Scope Out: Findings:                 The examined esophagus was moderately tortuous.                           The Z-line was regular and was found 36 cm from the                            incisors.                           The entire examined stomach was normal. Biopsies                            were taken with a cold forceps for histology.                           The examined duodenum was normal. Biopsies for                            histology were taken with a cold forceps for                            evaluation of celiac disease. Complications:            No immediate complications. Estimated Blood Loss:     Estimated blood loss: none. Impression:               - Normal EGD. Recommendation:           - Patient has a contact number available for                            emergencies. The signs and symptoms of potential  delayed complications were discussed with the                            patient. Return to normal activities tomorrow.                            Written discharge instructions were provided to the                            patient.                           - Resume previous diet.                           - Continue present medications.                           - Await pathology results.                           - FU if still with problems. Consider GES followed                            by HIDA scan.                           - The findings and recommendations were discussed                            with the patient's family. Lynnie Bring, MD 10/24/2024 8:53:47 AM This report has been signed electronically.

## 2024-10-25 ENCOUNTER — Telehealth: Payer: Self-pay

## 2024-10-25 NOTE — Telephone Encounter (Signed)
  Follow up Call-     10/24/2024    7:35 AM  Call back number  Post procedure Call Back phone  # [phone]  Permission to leave phone message Yes     Patient questions:  Do you have a fever, pain , or abdominal swelling? No. Pain Score  0 *  Have you tolerated food without any problems? Yes.    Have you been able to return to your normal activities? Yes.    Do you have any questions about your discharge instructions: Diet   No. Medications  No. Follow up visit  No.  Do you have questions or concerns about your Care? No.  Actions: * If pain score is 4 or above: No action needed, pain <4.

## 2024-10-26 LAB — SURGICAL PATHOLOGY

## 2024-11-03 ENCOUNTER — Ambulatory Visit: Payer: Self-pay | Admitting: Gastroenterology
